# Patient Record
Sex: Female | Born: 1960 | Race: White | Hispanic: No | Marital: Married | Smoking: Never smoker
Health system: Southern US, Academic
[De-identification: ages and names within clinical notes are randomized; demographics above are authoritative.]

## PROBLEM LIST (undated history)

## (undated) DIAGNOSIS — Z853 Personal history of malignant neoplasm of breast: Secondary | ICD-10-CM

## (undated) DIAGNOSIS — K219 Gastro-esophageal reflux disease without esophagitis: Secondary | ICD-10-CM

## (undated) DIAGNOSIS — E785 Hyperlipidemia, unspecified: Secondary | ICD-10-CM

## (undated) DIAGNOSIS — F411 Generalized anxiety disorder: Secondary | ICD-10-CM

## (undated) DIAGNOSIS — I82409 Acute embolism and thrombosis of unspecified deep veins of unspecified lower extremity: Secondary | ICD-10-CM

## (undated) DIAGNOSIS — G4730 Sleep apnea, unspecified: Secondary | ICD-10-CM

## (undated) DIAGNOSIS — M797 Fibromyalgia: Secondary | ICD-10-CM

## (undated) DIAGNOSIS — H409 Unspecified glaucoma: Secondary | ICD-10-CM

## (undated) DIAGNOSIS — I749 Embolism and thrombosis of unspecified artery: Secondary | ICD-10-CM

## (undated) DIAGNOSIS — K760 Fatty (change of) liver, not elsewhere classified: Secondary | ICD-10-CM

## (undated) DIAGNOSIS — J329 Chronic sinusitis, unspecified: Secondary | ICD-10-CM

## (undated) DIAGNOSIS — E039 Hypothyroidism, unspecified: Secondary | ICD-10-CM

## (undated) DIAGNOSIS — G43909 Migraine, unspecified, not intractable, without status migrainosus: Secondary | ICD-10-CM

## (undated) HISTORY — DX: Personal history of malignant neoplasm of breast: Z85.3

## (undated) HISTORY — DX: Fibromyalgia: M79.7

## (undated) HISTORY — DX: Chronic sinusitis, unspecified: J32.9

## (undated) HISTORY — DX: Unspecified glaucoma: H40.9

## (undated) HISTORY — PX: HX RADICAL MASTECTOMY: SHX4

## (undated) HISTORY — DX: Hyperlipidemia, unspecified: E78.5

## (undated) HISTORY — DX: Fatty (change of) liver, not elsewhere classified: K76.0

## (undated) HISTORY — DX: Sleep apnea, unspecified: G47.30

## (undated) HISTORY — DX: Generalized anxiety disorder: F41.1

## (undated) HISTORY — DX: Migraine, unspecified, not intractable, without status migrainosus: G43.909

## (undated) HISTORY — DX: Gastro-esophageal reflux disease without esophagitis: K21.9

## (undated) HISTORY — DX: Hypothyroidism, unspecified: E03.9

## (undated) HISTORY — DX: Embolism and thrombosis of unspecified artery (CMS HCC): I74.9

## (undated) HISTORY — DX: Acute embolism and thrombosis of unspecified deep veins of unspecified lower extremity (CMS HCC): I82.409
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
## (undated) NOTE — Progress Notes (Signed)
Formatting of this note might be different from the original.  Patient's daughter stop by nurses station and stated that her mother was going to be going to [facility] for placement. Daughter stated Ilda Mori called her this morning and spoke with daughter in regards to her mother. Stated Ilda Mori was working on everything at this time. Daughter stated she was going to go get her mother some clothes so she will have some clothes for her admission to [facility].    Electronically signed by Rosanne Ashing, CNA at 04/13/2022 10:28 AM EDT

## (undated) NOTE — ED Notes (Signed)
Formatting of this note might be different from the original.  CA transferred patient to [facility] via [place]. Handoff report was given. All belongings taken. Pain level is 0/10. IV in [place].  Electronically signed by Deeann Dowse, RN at 04/11/2022 12:53 AM EDT
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
## (undated) NOTE — Progress Notes (Signed)
Formatting of this note might be different from the original.  Pt arrived via wheelchair to [facility] Room 108 from ED.  Vital signs taken & noted.  Pt was oriented to room, admission packet reviewed including the patients rights,copy handed to pt and/or family.Teach back methodology was used for this education. The patient/caregiver verbalized in his/her own words appropriate understanding of what he/she has been taught.   Nursing to complete admission database and assessment once pt is settled.  Call system in reach, will continue to monitor pts needs.  Electronically signed by Fredia Sorrow, RN at 04/11/2022  1:05 AM EDT

## (undated) NOTE — Home Health (Signed)
Formatting of this note might be different from the original.  Per Robin, SN, pt admitted to SNF.  Telehealth ended and arrangements made for equipment to be picked up.    Thank YOU!    [facility]  [phone]  [email or web address]  Electronically signed by Burnett Corrente, RN at 04/17/2022 10:25 AM EDT
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
## (undated) NOTE — Procedures (Signed)
Associated Order(s): EKG (12-LEAD)  Formatting of this note might be different from the original.  Ekg completed.   Original placed in physicians office in ED for review and interpretation by ED physician.  Electronically signed by Theodosia Paling, RRT at 04/13/2022  1:54 PM EDT

## (undated) NOTE — Home Health (Signed)
Formatting of this note might be different from the original.  This is to report to Dr. and ctl that Aide documented a missed visit on 04/09/22.      The reason for the missed visit was: Other (specify):  Unable to contact  Electronically signed by Wilhemina Cash, [place] at 04/09/2022  4:12 PM EDT

## (undated) NOTE — Progress Notes (Signed)
Formatting of this note might be different from the original.    Problem: Infection  Goal: Absence of Infection Signs and Symptoms  Outcome: Ongoing  Intervention: Prevent or Manage Infection  Flowsheets (Taken 04/15/2022 2042)  Infection Management: aseptic technique maintained  Fever Reduction/Comfort Measures:   lightweight clothing   lightweight bedding    Problem: Adult Inpatient Plan of Care  Goal: Plan of Care Review  Outcome: Ongoing  Flowsheets (Taken 04/12/2022 0315 by Forde Dandy, RN)  Progress: no change  Plan of Care Reviewed With: patient  Goal: Patient-Specific Goal (Individualized)  Outcome: Ongoing  Goal: Absence of Hospital-Acquired Illness or Injury  Outcome: Ongoing  Intervention: Identify and Manage Fall Risk  Flowsheets (Taken 04/15/2022 2042)  Safety Promotion/Fall Prevention: activity supervised  Intervention: Prevent Skin Injury  Flowsheets  Taken 04/15/2022 2200  Body Position: turns independently  Taken 04/15/2022 2042  Skin Protection:   adhesive use limited   transparent dressing maintained   tubing/devices free from skin contact  Intervention: Prevent and Manage VTE (Venous Thromboembolism) Risk  Flowsheets  Taken 04/15/2022 2042 by Carles Collet, RN  Activity Management: activity encouraged  Range of Motion: active ROM (range of motion) encouraged  Taken 04/15/2022 1000 by Alger Memos, RN  VTE Prevention/Management: patient refused mechanical DVT prophylaxis, patient informed of risks  Intervention: Prevent Infection  Flowsheets (Taken 04/15/2022 2042)  Infection Prevention:   hand hygiene promoted   personal protective equipment utilized  Goal: Optimal Comfort and Wellbeing  Outcome: Ongoing  Intervention: Monitor Pain and Promote Comfort  Flowsheets (Taken 04/14/2022 1000 by Alger Memos, RN)  Pain Management Interventions:   care clustered   pillow support provided  Intervention: [place] (Taken 04/15/2022 2042)  Trust  Relationship/Rapport:   care explained   choices provided   emotional support provided   empathic listening provided   questions answered   questions encouraged   reassurance provided   thoughts/feelings acknowledged  Goal: Readiness for Transition of Care  Outcome: Ongoing  Intervention: Mutually Develop Transition Plan  Flowsheets (Taken 04/12/2022 1707 by Bernadene Person, PT, DPT)  Equipment Currently Used at Home: (Pt reports she has a walker; raised toilet seat; showever chair, however poor historian) --    Problem: Skin Injury Risk Increased  Goal: Skin Health and Integrity  Outcome: Ongoing  Intervention: Optimize Skin Protection  Flowsheets  Taken 04/15/2022 2042 by Carles Collet, RN  Pressure Reduction Techniques:   frequent weight shift encouraged   turned/repositioned   weight shift assistance provided  Head of Bed (HOB) Positioning: HOB at 30-45 degrees  Skin Protection:   adhesive use limited   transparent dressing maintained   tubing/devices free from skin contact  Taken 04/15/2022 1000 by Alger Memos, RN  Pressure Reduction Devices: specialty bed utilized  Intervention: Promote and Optimize Oral Intake  Flowsheets (Taken 04/15/2022 1000 by Alger Memos, RN)  Oral Nutrition Promotion: rest periods promoted    Problem: Confusion Acute  Goal: Optimal Cognitive Function  Outcome: Ongoing  Intervention: Minimize Contributing Factors  Flowsheets (Taken 04/15/2022 2042)  Sensory Stimulation Regulation:   care clustered   lighting decreased   television on  Reorientation Measures:   clock in view   glasses use encouraged  Environment Familiarity/Consistency: familiar objects from home provided    Problem: Communication Impairment  Goal: Effective Communication Skills  Outcome: Ongoing  Intervention: Designer, jewellery  Flowsheets (Taken 04/15/2022 2042)  Communication Enhancement Strategies: call light answered in  person    Electronically signed by Carles Collet, RN at 04/15/2022  11:11 PM EDT

## (undated) NOTE — Progress Notes (Signed)
Formatting of this note might be different from the original.  Pt resting quietly at this time. Side rails up x 2. Bed alarm in use. Encouraged to call for any assistance. Next shift to cont to monitor.  Electronically signed by Sallye Ober, RN at 04/11/2022  6:26 PM EDT

## (undated) NOTE — Nursing Note (Signed)
Formatting of this note might be different from the original.  Resting quietly in bed, respirations regular and non labored. Offered to assist patient to the bathroom, patient voiced, " no, I'm fine." Patient refused to have her pull-up checked or the bottom assessed at this time. Side rails up X2, bed down in lowest position, and call bell in easy reach. Will continue to monitor.   Electronically signed by Forde Dandy, RN at 04/12/2022  6:13 AM EDT

## (undated) NOTE — ED Notes (Signed)
Formatting of this note might be different from the original.  Per Dr. Franchot Erichsen, COVID test and urine test can wait until patient wakes up.  Electronically signed by Linde Gillis, RN at 04/10/2022 10:25 PM EDT

## (undated) NOTE — Progress Notes (Signed)
Formatting of this note might be different from the original.  Report given to [place]. D/c summary faxed to number provided. Awaiting transportation at this time.  Electronically signed by Sallye Ober, RN at 04/16/2022  5:17 PM EDT

## (undated) NOTE — Home Health (Signed)
Formatting of this note might be different from the original.  This is to report to Dr. and ctl that Aide documented a missed visit on 04/05/22.      The reason for the missed visit was: Other (specify): call pt and left a voicemail and no call back  Electronically signed by Wilhemina Cash, [place] at 04/05/2022  5:43 PM EDT

## (undated) NOTE — Progress Notes (Signed)
Formatting of this note might be different from the original.  Pt resting quietly with hob elevated. pleasantly confused to place. Hob elevated and side rails  Up x 2. Bed alarm in use. Encouraged to call for any assistance. Will cont to monitor.  Electronically signed by Sallye Ober, RN at 04/11/2022  7:35 AM EDT

## (undated) NOTE — Nursing Note (Signed)
Formatting of this note might be different from the original.  Pt tried to get out of bed to go to window, said her babies were outside and she wanted to see them. There wasn't anyone outside the window. Told pt that there wasn't anyone outside and that she needed to stay in bed. She said that she will go back to bed and tell them that her nurse wouldn't let her see them, told pt that if someone was coming to see her, they could come through the door to see her.   Electronically signed by Donato Heinz, [facility] at 04/11/2022  4:00 PM EDT

## (undated) NOTE — Consults (Signed)
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 encounter, ask questions, security issues and sharing information. The patient consented to proceed with the telemedicine encounter visit.     I spent 30 minutes on preparation for this visit on the date of service and 30 minutes directly with the patient.  I was located remotely at the following location during this encounter: office   Electronically signed by Yvette Rack, MD at 04/12/2022  5:30 PM EDT

## (undated) NOTE — Progress Notes (Signed)
Formatting of this note might be different from the original.  Patient refused to allow CNA to help with bathing needs and to be changed with first attempt. CNA attempt a second time and patient allowed CNA to help with bathing/bed change.    Electronically signed by Rosanne Ashing, CNA at 04/12/2022  4:37 PM EDT

## (undated) NOTE — Progress Notes (Signed)
Formatting of this note might be different from the original.    Problem: Infection  Goal: Absence of Infection Signs and Symptoms  Outcome: Ongoing  Intervention: Prevent or Manage Infection  Flowsheets (Taken 04/13/2022 2023)  Infection Management: aseptic technique maintained  Fever Reduction/Comfort Measures:   lightweight clothing   lightweight bedding    Problem: Adult Inpatient Plan of Care  Goal: Plan of Care Review  Outcome: Ongoing  Flowsheets (Taken 04/12/2022 0315 by Forde Dandy, RN)  Progress: no change  Plan of Care Reviewed With: patient  Goal: Patient-Specific Goal (Individualized)  Outcome: Ongoing  Goal: Absence of Hospital-Acquired Illness or Injury  Outcome: Ongoing  Intervention: Identify and Manage Fall Risk  Flowsheets (Taken 04/13/2022 2023)  Safety Promotion/Fall Prevention:   activity supervised   assistive device/personal items within reach   clutter-free environment maintained   lighting adjusted   nonskid shoes/slippers when out of bed   safety round/check completed  Intervention: Prevent Skin Injury  Flowsheets  Taken 04/13/2022 2159  Body Position:   left   turned   side-lying  Taken 04/13/2022 2023  Skin Protection:   adhesive use limited   transparent dressing maintained   tubing/devices free from skin contact  Intervention: Prevent and Manage VTE (Venous Thromboembolism) Risk  Flowsheets (Taken 04/13/2022 2023)  Activity Management: activity encouraged  VTE Prevention/Management: patient refused mechanical DVT prophylaxis, patient informed of risks  Range of Motion: active ROM (range of motion) encouraged  Intervention: Prevent Infection  Flowsheets (Taken 04/13/2022 2023)  Infection Prevention:   hand hygiene promoted   personal protective equipment utilized  Goal: Optimal Comfort and Wellbeing  Outcome: Ongoing  Intervention: Monitor Pain and Promote Comfort  Flowsheets (Taken 04/13/2022 0800 by Alger Memos, RN)  Pain Management Interventions:   care clustered   pillow support  provided   position adjusted  Intervention: Provide [place] (Taken 04/13/2022 2023)  Trust Relationship/Rapport:   care explained   choices provided   emotional support provided   empathic listening provided   questions answered   questions encouraged   reassurance provided   thoughts/feelings acknowledged  Goal: Readiness for Transition of Care  Outcome: Ongoing  Intervention: Mutually Develop Transition Plan  Flowsheets (Taken 04/12/2022 1707 by Bernadene Person, PT, DPT)  Equipment Currently Used at Home: (Pt reports she has a walker; raised toilet seat; showever chair, however poor historian) --    Problem: Skin Injury Risk Increased  Goal: Skin Health and Integrity  Outcome: Ongoing  Intervention: Optimize Skin Protection  Flowsheets  Taken 04/13/2022 2023 by Carles Collet, RN  Pressure Reduction Techniques:   frequent weight shift encouraged   independent turning  Skin Protection:   adhesive use limited   transparent dressing maintained   tubing/devices free from skin contact  Taken 04/13/2022 0800 by Alger Memos, RN  Head of Bed [facility]) Positioning: HOB at 30 degrees  Pressure Reduction Devices: specialty bed utilized  Intervention: Promote and Optimize Oral Intake  Flowsheets (Taken 04/12/2022 2120 by Forde Dandy, RN)  Oral Nutrition Promotion: rest periods promoted    Problem: Confusion Acute  Goal: Optimal Cognitive Function  Outcome: Ongoing  Intervention: Minimize Contributing Factors  Flowsheets (Taken 04/13/2022 2023)  Sensory Stimulation Regulation:   care clustered   television on  Reorientation Measures: clock in view  Environment Familiarity/Consistency: familiar objects from home provided    Problem: Communication Impairment  Goal: Effective Communication Skills  Outcome: Ongoing  Intervention: Control and instrumentation engineer (  Taken 04/13/2022 2023)  Communication Enhancement Strategies:   call light answered in person   extra time allowed for response    one-step directions provided    Electronically signed by Carles Collet, RN at 04/13/2022 10:17 PM EDT
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
## (undated) NOTE — Progress Notes (Signed)
Formatting of this note might be different from the original.    Problem: Infection  Goal: Absence of Infection Signs and Symptoms  Outcome: Ongoing  Intervention: Prevent or Manage Infection  Description: Implement transmission-based precautions and isolation, as indicated, to prevent spread of infection.  Obtain cultures prior to initiating antimicrobial therapy, when possible. Do not delay treatment for laboratory results in the presence of high suspicion or clinical indicators.  Administer ordered antimicrobial therapy promptly; reassess need regularly.  Monitor laboratory value, diagnostic test and clinical status trends for signs of infection progression.  Identify early signs of sepsis, such as increased heart rate and decreased blood pressure, as well as changes in mental state, respiratory pattern or peripheral perfusion.  Prepare for rapid sepsis management, including lactate level, intravenous access, fluid administration and oxygen therapy.  Provide fever-reduction and comfort measures.  Flowsheets (Taken 04/12/2022 2120)  Infection Management: aseptic technique maintained  Fever Reduction/Comfort Measures:   lightweight bedding   lightweight clothing    Problem: Adult Inpatient Plan of Care  Goal: Plan of Care Review  Outcome: Ongoing  Flowsheets (Taken 04/12/2022 0315)  Progress: no change  Plan of Care Reviewed With: patient  Goal: Patient-Specific Goal (Individualized)  Outcome: Ongoing  Goal: Absence of Hospital-Acquired Illness or Injury  Outcome: Ongoing  Intervention: Identify and Manage Fall Risk  Description: Perform standard risk assessment on admission using a validated tool or comprehensive approach appropriate to the patient; reassess fall risk frequently, with change in status or transfer to another level of care.  Communicate fall injury risk to interprofessional healthcare team.  Determine need for increased observation, equipment and environmental modification, such as low bed, signage  and supportive, nonskid footwear.  Adjust safety measures to individual developmental age, stage and identified risk factors.  Reinforce the importance of safety and physical activity with patient and family.  Perform regular intentional rounding to assess need for position change, pain assessment and personal needs, including assistance with toileting.  Flowsheets (Taken 04/12/2022 2334)  Safety Promotion/Fall Prevention:   safety round/check completed   room organization consistent   nonskid shoes/slippers when out of bed   assistive device/personal items within reach   clutter-free environment maintained   fall prevention program maintained   lighting adjusted  Intervention: Prevent Skin Injury  Description: Perform a screening for skin injury risk, such as pressure or moisture associated skin damage on admission and at regular intervals throughout hospital stay.  Keep all areas of skin (especially folds) clean and dry.  Maintain adequate skin hydration.  Relieve and redistribute pressure and protect bony prominences; implement measures based on patient-specific risk factors.  Match turning and repositioning schedule to clinical condition.  Encourage weight shift frequently; assist with reposition if unable to complete independently.  Float heels off bed; avoid pressure on the Achilles tendon.  Keep skin free from extended contact with medical devices.  Encourage functional activity and mobility, as early as tolerated.  Use aids (e.g., slide boards, mechanical lift) during transfer.  Flowsheets  Taken 04/12/2022 2335  Body Position:   position changed independently   turns independently  Taken 04/12/2022 2120  Skin Protection:   adhesive use limited   incontinence pads utilized   transparent dressing maintained   tubing/devices free from skin contact  Intervention: Prevent and Manage VTE (Venous Thromboembolism) Risk  Description: Assess for VTE (venous thromboembolism) risk.  Encourage and assist with early  ambulation.  Initiate and maintain compression or other therapy, as indicated, based on identified risk in accordance  with organizational protocol and provider order.  Encourage both active and passive leg exercises while in bed, if unable to ambulate.  Flowsheets (Taken 04/12/2022 2120)  Activity Management: activity encouraged  VTE Prevention/Management: patient refused mechanical DVT prophylaxis, patient informed of risks  Range of Motion: active ROM (range of motion) encouraged  Intervention: Prevent Infection  Description: Maintain skin and mucous membrane integrity; promote hand, oral and pulmonary hygiene.  Optimize fluid balance, nutrition, sleep and glycemic control to maximize infection resistance.  Identify potential sources of infection early to prevent or mitigate progression of infection (e.g., wound, lines, devices).  Evaluate ongoing need for invasive devices; remove promptly when no longer indicated.  Flowsheets (Taken 04/12/2022 2120)  Infection Prevention:   hand hygiene promoted   rest/sleep promoted   single patient room provided  Goal: Optimal Comfort and Wellbeing  Outcome: Ongoing  Intervention: Monitor Pain and Promote Comfort  Description: Assess pain level, treatment efficacy and patient response at regular intervals using a consistent pain scale.  Consider the presence and impact of preexisting chronic pain.  Encourage patient and caregiver involvement in pain assessment, interventions and safety measures.  Flowsheets (Taken 04/12/2022 2120)  Pain Management Interventions:   pillow support provided   position adjusted   quiet environment facilitated  Intervention: Provide Person-Centered Care  Description: Use a family-focused approach to care.  Develop trust and rapport by proactively providing information, encouraging questions, addressing concerns and offering reassurance.  Acknowledge emotional response to hospitalization.  Recognize and utilize personal coping strategies.  Honor  spiritual and cultural preferences.  Flowsheets (Taken 04/12/2022 2120)  Trust Relationship/Rapport:   care explained   choices provided   emotional support provided   empathic listening provided   questions answered   questions encouraged   reassurance provided   thoughts/feelings acknowledged  Goal: Readiness for Transition of Care  Outcome: Ongoing    Problem: Skin Injury Risk Increased  Goal: Skin Health and Integrity  Outcome: Ongoing  Intervention: Optimize Skin Protection  Description: Perform a full pressure injury risk assessment, as indicated by screening, upon admission to care unit.  Reassess skin (injury risk, full inspection) frequently (e.g., scheduled interval, with change in condition) to provide optimal early detection and prevention.  Maintain adequate tissue perfusion (e.g., encourage fluid balance; avoid crossing legs, constrictive clothing or devices) to promote tissue oxygenation.  Maintain head of bed at lowest degree of elevation tolerated, considering medical condition and other restrictions.  Avoid positioning onto an area that remains reddened.  Minimize incontinence and moisture (e.g., toileting schedule; moisture-wicking pad, diaper or incontinence collection device; skin moisture barrier).  Cleanse skin promptly and gently when soiled utilizing a pH-balanced cleanser.  Relieve and redistribute pressure (e.g., scheduled position changes, weight shifts, use of support surface, medical device repositioning, protective dressing application, use of positioning device, microclimate control, use of pressure-injury-monitor  Encourage increased activity, such as sitting in a chair at the bedside or early mobilization, when able to tolerate.  Flowsheets (Taken 04/12/2022 2120)  Pressure Reduction Techniques:   frequent weight shift encouraged   independent turning   tubing/devices free from under/on patient  Head of Bed (HOB) Positioning: HOB at 20-30 degrees  Pressure Reduction Devices: specialty  bed utilized  Skin Protection:   adhesive use limited   incontinence pads utilized   transparent dressing maintained   tubing/devices free from skin contact  Intervention: Promote and Optimize Oral Intake  Description: Perform a nutrition assessment; include a nutrition-focused physical exam.  Determine calorie, protein, vitamin, mineral  and fluid requirements.  Assess for micronutrient deficiencies; supplement if depleted.  Assess need and assist with meal set-up and feeding.  Adjust diet or meal schedule based on preferences and tolerance.  Offer oral supplemental food or drinks to enhance calorie and protein intake.  Establish bowel elimination program to increase comfort and appetite.  Minimize unnecessary dietary restrictions to increase oral intake.  Provide and encourage oral hygiene to enhance desire to eat.  Consider enteral nutrition support if oral intake remains inadequate; provide parenteral nutrition if enteral is contraindicated.  Flowsheets (Taken 04/12/2022 2120)  Oral Nutrition Promotion: rest periods promoted    Problem: Confusion Acute  Goal: Optimal Cognitive Function  Outcome: Ongoing  Intervention: Minimize Contributing Factors  Description: Establish baseline and ongoing mental status using a validated tool.  Investigate potential physiologic factors for confusion (e.g., hypoglycemia, hypoxia, fluid imbalance, sleep deprivation, malnutrition); advocate for and provide treatment.  Minimize stimulation; provide a quiet and calm environment; enlist the help of familiar family or caregiver.  Consider use of complementary and alternative medicine modalities (e.g., therapeutic touch, massage, aromatherapy).  Facilitate safe, barrier-free surroundings; keep needed items within reach (e.g., call light, personal belongings).  Promote use of personal vision and auditory aids.  Provide familiar items and orientation cues (e.g., calendar, clock, pictures).  Establish familiarity using a structured routine  congruent with home schedule, when possible (e.g., consistent caregiver, sleep and meal schedule, toileting schedule).  Use communication techniques that avoid confrontation and correction; approach each interaction as a new episode of engagement.  Flowsheets (Taken 04/12/2022 2120)  Sensory Stimulation Regulation:   auditory stimulation minimized   care clustered   lighting decreased   television on   quiet environment promoted  Reorientation Measures:   calendar in view   familiar social contact encouraged   glasses use encouraged   reorientation provided  Environment Familiarity/Consistency:   daily routine followed   familiar objects from home provided    Problem: Communication Impairment  Goal: Effective Communication Skills  Outcome: Ongoing  Intervention: Optimize Communication Skills  Description: Evaluate communication skills with cognitive-communication, speech and language assessments to identify areas of impairment.  Make hearing aids, glasses and dentures available.  Determine optimal means of communication; support multimodal methods and ensure consistent use.  Provide cuing and prompting, as needed, to encourage independence.  Utilize slow, distinct, concise speech with simple verbal and gestured commands as needed; allow adequate time for responses.  Provide a calm environment without excessive noise or distraction; avoid situations overly frustrating for the patient.  Provide therapeutic interventions to address communication impairment, such as auditory comprehension, verbal expression, motor speech and pragmatic language, as well as reading and writing skills.  Flowsheets (Taken 04/12/2022 2120)  Communication Enhancement Strategies:   extra time allowed for response   call light answered in person    Electronically signed by Forde Dandy, RN at 04/13/2022 12:26 AM EDT
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
## (undated) NOTE — Progress Notes (Signed)
Formatting of this note might be different from the original.  Pt refused evening meds and dinner tray. Medicated for nausea.  Electronically signed by Sallye Ober, RN at 04/11/2022  6:08 PM EDT

## (undated) NOTE — Nursing Note (Signed)
Formatting of this note might be different from the original.  In to ask patient to get up and sit in chair.  Patient refuses to get up, states her belly hurts.   Electronically signed by Alger Memos, RN at 04/14/2022  4:48 PM EDT

## (undated) NOTE — Nursing Note (Signed)
Formatting of this note might be different from the original.  Resting quietly in bed, watching television. Patient has a flat affect during conversation. Alert and oriented to person. Patient can not remember her birthday or year. Re-oriented patient to her birthday and surroundings. Patient refused on assessment to have her bottom or back assessed. Patient also refused to take her PO Lactulose Ordered, see MAR. Saline lock intact to left forearm with no redness or edema noted. Side rails up X2, bed down in lowest position, and call bell in easy reach. Bed alarm on and activated. Will continue to monitor.   Electronically signed by Forde Dandy, RN at 04/13/2022 12:33 AM EDT

## (undated) NOTE — Progress Notes (Signed)
Formatting of this note might be different from the original.  INPATIENT PHYSICAL THERAPY TREATMENT NOTE  Treatment Date:  04/13/2022    Treatment Start/Stop Time:  1:16 PM - 1:36 PM = 20 minutes     S:  Pt was compliant with PT this date.   O:  Chart reviewed.  General Observations:  Pt in bed on arrival.    Mobility: Bed Mobility: Supine to sit: Min Assist, instructional cueing, extra time  Sit to supine: Min Assist, instructional cueing, extra time  Transfers: Sit to stand: [place], instructional cueing, extra time, set-up from bed height to FWW  Stand to sit: Contact guard, instructional cueing, extra time  Gait: Gait details: Pt ambulated approximately 15 ft with FWW and therapist providing CGA with gait belt.    ROM/Therapeutic Exercise: STS from bed to FWW x 5  Pain (NPRS):  Location: Pt didn't report any pain this date.  Rest: 0/10  Activity: 0 /10       Patient/Caregiver Education: educated pt on transferring from seated to standing position and vice versa with the proper technique.  Interdisciplinary Contacts: Discussed patient status with nurse.  Patient left in bed with alarm re-activated, items in reach, lines intact and nursing aware    A:  Pt continues to meet criteria for skilled intervention during this admission. Pt ambulated approximately 15 ft with FWW and therapist providing CGA with gait belt. After ambulating, the therapist asked if pt wanted to perform more exercises seated on EOB before getting back in bed, however pt declined stating "I am ready to lay down, I will try to do better next time."    P:  Continue current plan of care 4-5/wk with focus on progressive mobility.    Therapist: Honor Junes, PTA         Date: 04/13/2022  Electronically signed by Honor Junes, PTA at 04/13/2022  1:56 PM EDT
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
## (undated) NOTE — Progress Notes (Signed)
Formatting of this note might be different from the original.  Pt leaving floor for mri at this time.  Electronically signed by Sallye Ober, RN at 04/16/2022 10:56 AM EDT
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
## (undated) NOTE — Progress Notes (Signed)
Formatting of this note might be different from the original.  Pt resting quietly at this time. Did get dizzy with PT. Vs stable. No acute distress noted. Side rails up x 2. Encouraged to call for any assistance. Next shift to cont to monitor.  Electronically signed by Sallye Ober, RN at 04/12/2022  6:44 PM EDT

## (undated) NOTE — Home Health (Signed)
Formatting of this note might be different from the original.  Patient is a 68 y/o female seen for OT home health evaluation following recent hospitalization at [facility]. Patient was found to be hypercoaguable (INR was 9 on admission), as patient has been on coumadin due to history of DVT and also has the Factor V clotting disorders. Patient went to [facility] ED on 03/02/22 due to a syncopal episode at [facility]. Family reported that she slowly slumped down, however did not fall. They also stated she had some slurred speech initially and was warm to touch, however when she arrived to [facility] ED she was alert and oriented x 3, but the family requested stroke protocol. Patient has a history of cirrhosis ([place]) which was reported by the family at the ED and stated she had been taking lactulose for high ammonia levels, but recently had not been taking it. Patient was admitted for observation and was discharged home with home health to assist with education on coagulation defect, unspecified and other disease processes as well as medication education. Her daughter manages her medications, and noted that the patient has not been taking her lactulose as prescribed.   Patient lives in her single wide trailer with her daughter who is her primary caregiver. Patient needed set up to min A for ADLs PLOF, Caregiver provides IADLs. Patient was ambulating with SPC as needed for assistive device. Patient now needs min to mod A for ADLs and continues to ambulate with a cane but has noted balance and gait deficits. Patient is increased falls risk. Patient demonstrating functional BUE AROM but with weakness noted. Patient is alert and oriented to year and president. Unable to correctly recall day, date, month or season. Caregiver reports that patient frequently asks the same questions and repeats herself. Patient fatigues with minimal activity. Patient goal is to be more mobile and to be able to do more for herself.   Plan for next visit, begin memory  assessments for SLUMS/MOCA, begin UE HEP    Past Medical History: Syncope and collapse, history of DVT, Cirrhosis ([place]), chronic anemia, hypothyroidism, hyperlipidemia, unspecified, anxiety, migraine headaches, depression, seizure disorder, obesity, Factor V Leiden Deficiency, hepatic encephalopathy, GERD and long term (current) use of anticoagulants, h/o R breast cancer in 2007 s/p mastectomy, radiation and chemo  Electronically signed by Sadie Haber, OTR at 04/09/2022 10:05 AM EDT

## (undated) NOTE — Nursing Note (Signed)
Formatting of this note might be different from the original.  Resting quietly in bed quietly. Alert and awake when entered room. Denies needs or concerns. Encouraged patient to call when needed.   Electronically signed by Forde Dandy, RN at 04/12/2022  6:14 AM EDT

## (undated) NOTE — Progress Notes (Signed)
Formatting of this note might be different from the original.  Per staff in the business office @ [facility] this patient is a long term care resident. Patient is removed from [place], [facility] 05/22/2022 11:53 AM    Electronically signed by Huston Foley, MOA at 05/22/2022 11:53 AM EDT
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
## (undated) NOTE — Progress Notes (Signed)
Formatting of this note might be different from the original.  Pt resting quietly with hob elevated. Ambulatory to bsc with assistance. Call light in reach.. bed alarm in use. Encouraged to call for any assistance. Will cont to monitor.  Electronically signed by Sallye Ober, RN at 04/16/2022  9:49 AM EDT
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
## (undated) NOTE — Nursing Note (Signed)
Formatting of this note might be different from the original.  Up to bedside commode with assistance. Voided and passed a moderate bowel movement with out difficulty. C/o of dizziness when standing up and changing position to go back to bed. Side rails up X2, bed down in lowest position, and call bell in easy reach. Bed alarm on and activated.   Electronically signed by Forde Dandy, RN at 04/13/2022  2:47 AM EDT

## (undated) NOTE — Consults (Signed)
Formatting of this note is different from the original.  Images from the original note were not included.    Department of Pharmacy    Pharmacy Progress Note  Warfarin Dosing Consult     Subjective/Objective     Kelly Harrison is a 5 y.o. female who is receiving warfarin for history of DVT     Reported home warfarin regimen: '7mg'$     (Source: [facility])    Goal INR is 2-3 with order to hold warfarin for INR 3.5 per Prescriber.    Height: 167.6 cm ('5\' 6"'$ )  Weight: 104.8 kg (231 lb)    Recent Labs     04/15/22  0402 04/14/22  0345   HGB 11.8* 12.2   HCT 36.0 37.9     Warfarin Dosing History During this Admission    Date INR Dose  04/12/22 2.1 '3mg'$    04/13/22 1.8 None given  04/14/22 2.0 '3mg'$   04/15/22 1.4 '4mg'$      Pertinent Drug Interactions / Anticoagulation Therapy:     Significant drug-drug interactions:  none  Bridging/overlap therapy: no    Assessment/Plan:     1. Today's warfarin dose: will give additional '1mg'$  for total dose today='4mg'$   2. Current regimen: '3mg'$  daily  1. Obtain INR daily.     Please call Pharmacy with any questions.    Thank you,  Osker Mason, PharmD 04/15/2022 6:31 AM    Electronically signed by Osker Mason, PharmD at 04/15/2022  6:33 AM EDT

## (undated) NOTE — Procedures (Signed)
Associated Order(s): EKG (12-LEAD)  Formatting of this note might be different from the original.  Ekg completed.   Original placed in physicians office in ED for review and interpretation by ED physician.  Electronically signed by Theodosia Paling, RRT at 04/10/2022  3:31 PM EDT

## (undated) NOTE — ED Notes (Signed)
Formatting of this note is different from the original.  Images from the original note were not included.    ED Provider Note    Subjective     Chief Complaint   Patient presents with   ? Altered mental status   ? Hand Burn   ? Burn     Both butt cheeks     Accepted transfer of care from Dr. Humphrey Rolls pending labs, treatment response, refer to original note for HPI, ROS, and physical exam.     Objective     ED Triage Vitals [04/10/22 1416]   BP Pulse Resp Temp SpO2 Flow (L/min)   117/57 107 18 97.8 F (36.6 C) 94 % --     Patient Vitals for the past 24 hrs:   BP Temp Pulse Resp SpO2   04/10/22 2242 (!) 166/82 -- 101 20 98 %   04/10/22 2101 (!) 145/84 -- 100 18 98 %   04/10/22 1915 -- -- -- 18 --   04/10/22 1631 (!) 119/54 98.1 F (36.7 C) (!) 122 18 --   04/10/22 1416 117/57 97.8 F (36.6 C) 107 18 94 %     Physical Exam    Results for orders placed or performed during the hospital encounter of 04/10/22   CBC WITH AUTO DIFF (CBCD)   Result Value Ref Range    WBC 8.8 4.0 - 10.5 K/uL    RBC 4.00 (L) 4.1 - 5.1 M/uL    Hemoglobin 12.8 12.0 - 16.0 g/dL    Hematocrit 39.1 36 - 46 %    MCV 97.8 78 - 98 fL    MCH 32.0 27 - 34.6 pg    MCHC 32.7 (L) 33 - 37 g/dL    RDW 15.3 (H) 11.5 - 14.5 %    Platelet Count 272 130 - 400 K/uL    MPV 10.6 9.4 - 12.4 fL    Seg 63.6 %    Lymph 25.6 %    Monos 9.0 %    Eos 1.3 %    Baso 0.5 %    Absolute Neut 5.6 1.8 - 7.7 K/uL    Absolute Lymph 2.3 1.0 - 5.0 K/uL    Absolute Mono 0.8 0 - 0.8 K/uL    Absolute Eos 0.1 0 - 0.4 K/uL    Absolute Basophils 0.0 0 - 0.2 K/uL   LACTIC ACID (LACT)   Result Value Ref Range    Lactic Acid 2.0 0.4 - 2.2 MMOL/L   AMMONIA (NH3)   Result Value Ref Range    Ammonia 23 11 - 32 UMOL/L   ALCOHOL [facility])   Result Value Ref Range    Alcohol <0.003 <0.003 % WT/VOL    Collection Tech Id [id number]     Iodine Prep Used? Yes Yes   COMPREHENSIVE METABOLIC PANEL(COMP)   Result Value Ref Range    Sodium 142 136 - 148 MMOL/L    Potassium 3.0 (L) 3.5 - 5.2 MMOL/L    Chloride 107  98 - 108 MMOL/L    CO2 25 20 - 32 MMOL/L    Urea Nitrogen 9 7 - 23 MG/DL    Creatinine 1.09 (H) 0.55 - 1.02 MG/DL    Glucose, Bld 105 74 - 106 mg/dL    Total Protein 7.3 5.7 - 8.2 G/DL    Albumin 3.5 3.2 - 5.0 G/DL    Calcium 10.5 (H) 8.5 - 10.4 MG/DL    Total Bilirubin 0.3 0.2 - 1.1 MG/DL  Alkaline Phosphatase, Serum 66 46 - 116 U/L    AST 22 15 - 37 U/L    ALT 22 14 - 59 U/L    Globulin 3.8 1.7 - 3.9 G/DL    Albumin/Globulin 0.9 0.7 - 2.3 RATIO    Anion Gap 10 8 - 18 mmol/L    Osmolality Calc 293 278 - 305 MOS/KG    Bun/Creatinine 8.3 7 - 20    Glom Filt Rate, Estimated 56 (L) >60 ml/min/1.26m   MAGNESIUM(MG)   Result Value Ref Range    Magnesium 1.7 1.6 - 2.6 MG/DL   APTT(PTT)   Result Value Ref Range    PTT 36.0 (H) 22 - 34 SEC   PROTIME-INR(PT)   Result Value Ref Range    Pt(Patient) 30.8 (H) 9.0 - 11.5 SEC    INR 3.0 Ratio   EKG (12-LEAD)    Narrative    DTheodosia Paling RRT     04/10/2022  3:31 PM  Ekg completed.   Original placed in physicians office in ED for   review and interpretation by ED physician.     Assessment and Plan:     MDM "Risk" in the MDM section refers to billing criteria on potential for complications and/or morbidity/mortality of management as defined by the [facility]and CMS    Procedures     ED Course:  ED Course as of 04/11/22 0003   Tue Apr 10, 2022   1449 ALCOHOL ([facility] [LM]   1449 DRUG SCREEN 13, UNCONFIRM URINE (UDS13)([facility]-DO NOT ORDER) URINE-RANDOM [LM]   1449 URINALYSIS(UA) URINE-DIAGNOSTIC CATHETER [LM]   1449 LACTIC ACID (LACT) [LM]   12671URINALYSIS(UA) URINE-CLEAN CATCH [LM]   1449 PROTIME-INR(PT) [LM]   1449 APTT(PTT) [LM]   1449 COMPREHENSIVE METABOLIC PANEL(COMP) [LM]   1449 CBC WITH AUTO DIFF (CBCD) [LM]   12458MAGNESIUM(MG) [LM]   1449 XR CHEST 1 VW [LM]   10998EKG (12-LEAD) [LM]   1457 XR CHEST 1 VW  Radiologist impression as follows: No acute findings   [LM]   1714 Ammonia: 23 [LM]   1714 Lactic Acid: 2.0 [LM]   1824 Potassium(!): 3.0  Will replenish w/ 40 meq PO  supplementation. [LM]   2241 SARS COV 2, INFLU. A/B, RSV PCR (COFRP) NASOPHARYNX [LM]     ED Course User Index  [LM] MDeforest Hoyles Scribe     Medications   lactulose ([facility] solution 30 g (30 g Oral Given 04/10/22 1828)   potassium chloride 20 mEq/15 mL solution 40 mEq (40 mEq Oral Given 04/10/22 1828)   haloperidol lactate (HALDOL) injection 2 mg (2 mg Intravenous Given 04/10/22 1906)     9:00 PM Pt seen and examined. Pt currently sleeping soundly, will continue to monitor.    10:28 PM [place] to obtain TDO.   11:42 PM patient is awake alert inappropriate laughter nursing staff obtaining urine specimen.  12:01 AM discussion with nursing staff who had spoken to the patient's daughter this is not a new occurrence for the patient patient has had progressive worsening dementia for some time she is just in a point where she is unable to care for the patient and feels patient needs to be placed into a facility.  Family is requesting patient be admitted for nursing home placement.  Reviewed laboratory studies.  Discussed with patient replace patient potassium and admit for placement.  Clinical Impression:  1. Agitated    2. Aggressive behavior  3. Restless    4. Debility    5. Acute encephalopathy      Condition: Stable    Disposition: Admit    Documentation prepared by Deforest Hoyles, Scribe acting as medical scribe for Feliberto Harts MD. I was present and performed active documentation with the provider while the patient was being cared for in the emergency department. 04/10/2022 8:34 PM    Portions of this note were prepared by a scribe and I have reviewed with the scribe and verify the authenticity of this record as a scribed note undertaken during the medical encounter. Fraser Din, MD 04/11/2022 12:03 AM      Electronically signed by Fraser Din, MD at 04/11/2022 12:03 AM EDT

## (undated) NOTE — Home Health (Signed)
Formatting of this note might be different from the original.  SNA  Continued education toward patient goals  Electronically signed by Adella Nissen, RN at 04/11/2022  6:42 AM EDT

## (undated) NOTE — Progress Notes (Signed)
Formatting of this note might be different from the original.  Care plan reviewed  Electronically signed by Alger Memos, RN at 04/14/2022  4:45 PM EDT

## (undated) NOTE — Home Health (Signed)
Formatting of this note might be different from the original.  [facility] IDT Meeting Report  Visit dates 04/15/2022 to 04/21/2022    Patient has been [place] from [facility] services  No further weekly reports to follow    SN: DC from hospital to [facility] 04/16/2022    PT: Missed visit    OT: Missed visit    ST: Not in at this time    SW: Initial consult completed; monitoring for follow up  Electronically signed by Lazarus Salines, PTA at 04/23/2022  9:25 AM EDT

## (undated) NOTE — Unmapped External Note (Signed)
Formatting of this note might be different from the original.  UAI submitted to [facility] for completion.  Electronically signed by Sterling Big, RN at 04/13/2022 12:31 PM EDT

## (undated) NOTE — Nursing Note (Signed)
Formatting of this note might be different from the original.  Patient is refusing to get up to chair this morning.  Will attempt again later.   Electronically signed by Alger Memos, RN at 04/15/2022  9:01 AM EDT

## (undated) NOTE — Progress Notes (Signed)
Formatting of this note might be different from the original.    Problem: Infection  Goal: Absence of Infection Signs and Symptoms  Outcome: Ongoing  Intervention: Prevent or Manage Infection  Description: Implement transmission-based precautions and isolation, as indicated, to prevent spread of infection.  Obtain cultures prior to initiating antimicrobial therapy, when possible. Do not delay treatment for laboratory results in the presence of high suspicion or clinical indicators.  Administer ordered antimicrobial therapy promptly; reassess need regularly.  Monitor laboratory value, diagnostic test and clinical status trends for signs of infection progression.  Identify early signs of sepsis, such as increased heart rate and decreased blood pressure, as well as changes in mental state, respiratory pattern or peripheral perfusion.  Prepare for rapid sepsis management, including lactate level, intravenous access, fluid administration and oxygen therapy.  Provide fever-reduction and comfort measures.  Flowsheets  Taken 04/11/2022 2100 by Forde Dandy, RN  Infection Management: aseptic technique maintained  Taken 04/11/2022 0058 by Fredia Sorrow, RN  Fever Reduction/Comfort Measures:   lightweight bedding   lightweight clothing    Problem: Adult Inpatient Plan of Care  Goal: Plan of Care Review  Outcome: Ongoing  Flowsheets (Taken 04/12/2022 0315)  Progress: no change  Plan of Care Reviewed With: patient  Goal: Patient-Specific Goal (Individualized)  Outcome: Ongoing  Goal: Absence of Hospital-Acquired Illness or Injury  Outcome: Ongoing  Intervention: Identify and Manage Fall Risk  Description: Perform standard risk assessment on admission using a validated tool or comprehensive approach appropriate to the patient; reassess fall risk frequently, with change in status or transfer to another level of care.  Communicate fall injury risk to interprofessional healthcare team.  Determine need for increased observation,  equipment and environmental modification, such as low bed, signage and supportive, nonskid footwear.  Adjust safety measures to individual developmental age, stage and identified risk factors.  Reinforce the importance of safety and physical activity with patient and family.  Perform regular intentional rounding to assess need for position change, pain assessment and personal needs, including assistance with toileting.  Flowsheets (Taken 04/12/2022 0200)  Safety Promotion/Fall Prevention:   safety round/check completed   nonskid shoes/slippers when out of bed   room organization consistent   assistive device/personal items within reach   clutter-free environment maintained   fall prevention program maintained   lighting adjusted  Intervention: Prevent Skin Injury  Description: Perform a screening for skin injury risk, such as pressure or moisture associated skin damage on admission and at regular intervals throughout hospital stay.  Keep all areas of skin (especially folds) clean and dry.  Maintain adequate skin hydration.  Relieve and redistribute pressure and protect bony prominences; implement measures based on patient-specific risk factors.  Match turning and repositioning schedule to clinical condition.  Encourage weight shift frequently; assist with reposition if unable to complete independently.  Float heels off bed; avoid pressure on the Achilles tendon.  Keep skin free from extended contact with medical devices.  Encourage functional activity and mobility, as early as tolerated.  Use aids (e.g., slide boards, mechanical lift) during transfer.  Flowsheets  Taken 04/12/2022 0000  Body Position:   position changed independently   turns independently  Taken 04/11/2022 2100  Skin Protection:   adhesive use limited   incontinence pads utilized   transparent dressing maintained   tubing/devices free from skin contact  Intervention: Prevent and Manage VTE (Venous Thromboembolism) Risk  Description: Assess for VTE  (venous thromboembolism) risk.  Encourage and assist with early ambulation.  Initiate and maintain compression or other therapy, as indicated, based on identified risk in accordance with organizational protocol and provider order.  Encourage both active and passive leg exercises while in bed, if unable to ambulate.  Flowsheets (Taken 04/11/2022 2100)  Activity Management: activity encouraged  VTE Prevention/Management: patient refused mechanical DVT prophylaxis, patient informed of risks  Range of Motion: active ROM (range of motion) encouraged  Intervention: Prevent Infection  Description: Maintain skin and mucous membrane integrity; promote hand, oral and pulmonary hygiene.  Optimize fluid balance, nutrition, sleep and glycemic control to maximize infection resistance.  Identify potential sources of infection early to prevent or mitigate progression of infection (e.g., wound, lines, devices).  Evaluate ongoing need for invasive devices; remove promptly when no longer indicated.  Flowsheets (Taken 04/11/2022 2100)  Infection Prevention:   hand hygiene promoted   rest/sleep promoted   single patient room provided  Goal: Optimal Comfort and Wellbeing  Outcome: Ongoing  Intervention: Monitor Pain and Promote Comfort  Description: Assess pain level, treatment efficacy and patient response at regular intervals using a consistent pain scale.  Consider the presence and impact of preexisting chronic pain.  Encourage patient and caregiver involvement in pain assessment, interventions and safety measures.  Flowsheets (Taken 04/11/2022 2100)  Pain Management Interventions:   pillow support provided   position adjusted   quiet environment facilitated  Intervention: Provide Person-Centered Care  Description: Use a family-focused approach to care.  Develop trust and rapport by proactively providing information, encouraging questions, addressing concerns and offering reassurance.  Acknowledge emotional response to  hospitalization.  Recognize and utilize personal coping strategies.  Honor spiritual and cultural preferences.  Flowsheets (Taken 04/11/2022 2100)  Trust Relationship/Rapport:   care explained   choices provided   emotional support provided   empathic listening provided   questions encouraged   questions answered   reassurance provided   thoughts/feelings acknowledged  Goal: Readiness for Transition of Care  Outcome: Ongoing    Problem: Skin Injury Risk Increased  Goal: Skin Health and Integrity  Outcome: Ongoing  Intervention: Optimize Skin Protection  Description: Perform a full pressure injury risk assessment, as indicated by screening, upon admission to care unit.  Reassess skin (injury risk, full inspection) frequently (e.g., scheduled interval, with change in condition) to provide optimal early detection and prevention.  Maintain adequate tissue perfusion (e.g., encourage fluid balance; avoid crossing legs, constrictive clothing or devices) to promote tissue oxygenation.  Maintain head of bed at lowest degree of elevation tolerated, considering medical condition and other restrictions.  Avoid positioning onto an area that remains reddened.  Minimize incontinence and moisture (e.g., toileting schedule; moisture-wicking pad, diaper or incontinence collection device; skin moisture barrier).  Cleanse skin promptly and gently when soiled utilizing a pH-balanced cleanser.  Relieve and redistribute pressure (e.g., scheduled position changes, weight shifts, use of support surface, medical device repositioning, protective dressing application, use of positioning device, microclimate control, use of pressure-injury-monitor  Encourage increased activity, such as sitting in a chair at the bedside or early mobilization, when able to tolerate.  Flowsheets (Taken 04/11/2022 2100)  Pressure Reduction Techniques:   frequent weight shift encouraged   independent turning   tubing/devices free from under/on patient  Head of Bed  (HOB) Positioning: HOB at 30-45 degrees  Pressure Reduction Devices: positioning supports utilized  Skin Protection:   adhesive use limited   incontinence pads utilized   transparent dressing maintained   tubing/devices free from skin contact  Intervention: Promote and Optimize Oral Intake  Description:  Perform a nutrition assessment; include a nutrition-focused physical exam.  Determine calorie, protein, vitamin, mineral and fluid requirements.  Assess for micronutrient deficiencies; supplement if depleted.  Assess need and assist with meal set-up and feeding.  Adjust diet or meal schedule based on preferences and tolerance.  Offer oral supplemental food or drinks to enhance calorie and protein intake.  Establish bowel elimination program to increase comfort and appetite.  Minimize unnecessary dietary restrictions to increase oral intake.  Provide and encourage oral hygiene to enhance desire to eat.  Consider enteral nutrition support if oral intake remains inadequate; provide parenteral nutrition if enteral is contraindicated.  Flowsheets (Taken 04/11/2022 2100)  Oral Nutrition Promotion: rest periods promoted    Problem: Confusion Acute  Goal: Optimal Cognitive Function  Outcome: Ongoing  Intervention: Minimize Contributing Factors  Description: Establish baseline and ongoing mental status using a validated tool.  Investigate potential physiologic factors for confusion (e.g., hypoglycemia, hypoxia, fluid imbalance, sleep deprivation, malnutrition); advocate for and provide treatment.  Minimize stimulation; provide a quiet and calm environment; enlist the help of familiar family or caregiver.  Consider use of complementary and alternative medicine modalities (e.g., therapeutic touch, massage, aromatherapy).  Facilitate safe, barrier-free surroundings; keep needed items within reach (e.g., call light, personal belongings).  Promote use of personal vision and auditory aids.  Provide familiar items and orientation  cues (e.g., calendar, clock, pictures).  Establish familiarity using a structured routine congruent with home schedule, when possible (e.g., consistent caregiver, sleep and meal schedule, toileting schedule).  Use communication techniques that avoid confrontation and correction; approach each interaction as a new episode of engagement.  Flowsheets (Taken 04/11/2022 2100)  Sensory Stimulation Regulation:   auditory stimulation minimized   auditory stimulation provided   lighting decreased   quiet environment promoted  Reorientation Measures:   clock in view   glasses use encouraged   reorientation provided  Environment Familiarity/Consistency:   daily routine followed   familiar objects from home provided   personal clothing/items utilized    Problem: Communication Impairment  Goal: Effective Communication Skills  Outcome: Ongoing  Intervention: Designer, jewellery  Description: Evaluate communication skills with cognitive-communication, speech and language assessments to identify areas of impairment.  Make hearing aids, glasses and dentures available.  Determine optimal means of communication; support multimodal methods and ensure consistent use.  Provide cuing and prompting, as needed, to encourage independence.  Utilize slow, distinct, concise speech with simple verbal and gestured commands as needed; allow adequate time for responses.  Provide a calm environment without excessive noise or distraction; avoid situations overly frustrating for the patient.  Provide therapeutic interventions to address communication impairment, such as auditory comprehension, verbal expression, motor speech and pragmatic language, as well as reading and writing skills.  Flowsheets (Taken 04/11/2022 2100)  Communication Enhancement Strategies: call light answered in person    Electronically signed by Forde Dandy, RN at 04/12/2022  3:19 AM EDT

## (undated) NOTE — Telephone Encounter (Signed)
 Formatting of this note might be different from the original.  Patient's daughter called and is requesting a letter for medical necessity stating the patient needs to stay at [facility] . The adiministratior is Davina Hieatt. She said she needs the letter today.  Electronically signed by Arloa Orlean LABOR, LPN at 90/74/7976 10:41 AM EDT

## (undated) NOTE — Nursing Note (Signed)
Formatting of this note might be different from the original.  Patient awake upon entering room. Patient refused to have pull- up checked or changed. Offered to take patient to the bathroom or use the bedside commode, and patient refused. Patient states, " no, I'm fine." Side rails up X2, bed down in lowest position, and call bell in reach. Bed alarm on and in use. Will continue to monitor.   Electronically signed by Forde Dandy, RN at 04/12/2022  6:09 AM EDT

## (undated) NOTE — Progress Notes (Signed)
Formatting of this note might be different from the original.  Pt leaving floor at this time. All belongings taken by daughter and pt. No complaints voiced. No acute distress noted at this time. TO T transporting.   Electronically signed by Sallye Ober, RN at 04/16/2022  6:01 PM EDT

## (undated) NOTE — Progress Notes (Signed)
Formatting of this note is different from the original.  Medical Nutrition Therapy Assessment  Chart reviewed for MST Score 2: Unsure of unintentional wt loss.     Recommendations:   - Continue current diet.    Estimated Nutrition Needs (using wt of 105 kg):  Kcals: 2100 kcals (20 kcals/kg)  Protein: 84 gm (0.8 gm/kg)  Fluid: 30m/kcal or per provider  Energy Intake:   - PO intake: 0% on DIET REGULAR Continuous   Physical Findings:   Wt Hx:      Treatment Plan  '[]'$  Liberalize diet  '[]'$  Oral nutrition supplements  '[]'$  Vitamin / mineral supplementation  '[]'$  Tube feeding  '[]'$  Parenteral nutrition  '[x]'$  Continue current nutrition plan of care  '[]'$  Other -      Anthropometrics:  Ht Readings from Last 1 Encounters:   04/11/22 1.676 m ('5\' 6"'$ )     Wt Readings from Last 1 Encounters:   04/11/22 104.8 kg (231 lb)     IBW:                           Body mass index is 37.28 kg/m. - obese (class ll)     35.0-39.9  %Ideal wt: 178%    Wt Readings from Last 5 Encounters:   04/11/22 104.8 kg (231 lb)   03/24/22 108.4 kg (239 lb)   03/14/22 108.6 kg (239 lb 8 oz)   03/07/22 108.4 kg (239 lb)   03/04/22 108.4 kg (239 lb)     Past Medical/Surgical Hx:     Past Medical History:   Diagnosis Date   ? Acute bronchitis 11/26/2013   ? Acute gastritis 05/29/2015   ? Acute pharyngitis 11/26/2013   ? Anxiety disorder    ? Arthritis    ? Bilious vomiting with nausea 05/29/2015   ? Breast cancer ([facility]     Sees Dr SEnriqueta Shutter  ? Chronic back pain    ? Deep vein thrombosis (DVT) (HCC)    ? Dehydration 05/29/2015   ? Dyslipidemia    ? Factor V Leiden    ? Fatty liver 04/02/2018   ? Fibromyalgia    ? GERD (gastroesophageal reflux disease)    ? Glaucoma    ? H/O total mastectomy of right breast 03/24/2020   ? [facility]atic encephalopathy [place] 05/26/2016    Secondary to medication withdrawal   ? Hypothyroidism    ? Migraines    ? Peptic ulcer    ? [place]/23[facility]   ? Shoulder fracture, right    ? Sinusitis 11/26/2013   ? Sleep apnea        Past Surgical History:    Procedure Laterality Date   ? HX CESAREAN SECTION     ? HX MODIFIED RADICAL MASTECTOMY Right     with lymph node removal   ? MASTECTOMY, PARTIAL Right    ? MASTECTOMY, SIMPLE, COMPLETE Right      Pertinent Medications:   Current Facility-Administered Medications   Medication Indication(s) Dose Route Frequency   ? atorvastatin (LIPITOR) tablet 20 mg    20 mg Oral DAILY   ? divalproex (DEPAKOTE SPRINKLE) capsule 250 mg    250 mg Oral BID   ? DULoxetine (CYMBALTA) capsule 60 mg    60 mg Oral DAILY   ? famotidine (PEPCID) tablet 20 mg    20 mg Oral BID   ? levothyroxine (SYNTHROID) tablet 50 mcg    50 mcg Oral DAILY   ?  pantoprazole (PROTONIX) tablet 40 mg    40 mg Oral DAILY   ? warfarin (COUMADIN) tablet 3 mg    3 mg Oral DAILY   ? D5W Adult Carrier Fluid    30 mL Intravenous PRN   ? NS Adult Carrier Fluid    30 mL Intravenous PRN   ? acetaminophen (TYLENOL) tablet 650 mg    650 mg Oral Q6H PRN    Or   ? acetaminophen (TYLENOL) suppository 650 mg    650 mg Rectal Q6H PRN   ? melatonin oral tablet 3 mg    3 mg Oral QHS PRN   ? lactulose (CHRONULAC) solution 30 g    30 g Oral BID   ? [START ON 04/12/2022] influenza quadrivalent vaccine 2023-24 (FLULAVAL) 60 mcg-0.5 mL IM syringe    0.5 mL IntraMUSCULAR ONCE   ? ondansetron (ZOFRAN) injection 4 mg    4 mg Intravenous Q8H PRN   ? pregabalin (LYRICA) capsule 150 mg    150 mg Oral TID     Pertinent Labs: K 3.4, GFR 71    Allergies   Allergen Reactions   ? Oxycodone Other - See Comments   ? Azithromycin Diarrhea     I/O:     Intake/Output Summary (Last 24 hours) at 04/11/2022 2215  Last data filed at 04/11/2022 1800  Gross per 24 hour   Intake 630 ml   Output --   Net 630 ml     Skin:     Braden: 19    Assessment  Inadequate intake x 1 day due to confusion and picking at food.    Goals: Intake >50% of most meals over next 5 days, BM every 1-3 days, nutrition-related labs WNL, no significant wt loss during LOS, wound healing/preserved skin integrity, optimize blood glucose  control, adequate hydration.     Pt is at moderate nutrition risk - RD will f/u every 3-5 days. Consult prn.    Skip Mayer, RD  Phone #: [phone]    Electronically signed by Skip Mayer, RD at 04/11/2022 10:29 PM EDT

## (undated) NOTE — Consults (Signed)
Associated Order(s): CONSULT TO PHARMACY ([facility])  Formatting of this note is different from the original.  Images from the original note were not included.    Department of Pharmacy    Pharmacy Progress Note  Warfarin Dosing Consult     Subjective/Objective     Kelly Harrison is a 16 y.o. female who is receiving warfarin for history of DVT     Reported home warfarin regimen: '7mg'$     (Source: [facility])    Goal INR is 2-3 with order to hold warfarin for INR 3.5 per Prescriber.    Height: 167.6 cm ('5\' 6"'$ )  Weight: 104.8 kg (231 lb)    Recent Labs     04/12/22  0540 04/11/22  0515   HGB 11.7* 12.3   HCT 36.7 36.6     Warfarin Dosing History During this Admission    Date INR Dose  04/12/22 2.1 '3mg'$       Pertinent Drug Interactions / Anticoagulation Therapy:     Significant drug-drug interactions:  none  Bridging/overlap therapy: no    Assessment/Plan:     1. Today's warfarin dose: '3mg'$     1. Current regimen: '3mg'$   2. Obtain INR daily.     Please call Pharmacy with any questions.    Thank you,  Everardo Pacific, PharmD  Electronically signed by Angelica Pou, PharmD at 04/12/2022  5:00 PM EDT

## (undated) NOTE — Progress Notes (Signed)
Formatting of this note might be different from the original.    Problem: Infection  Goal: Absence of Infection Signs and Symptoms  Outcome: Ongoing  Intervention: Prevent or Manage Infection  Flowsheets (Taken 04/14/2022 2026)  Infection Management: aseptic technique maintained  Fever Reduction/Comfort Measures:   lightweight bedding   lightweight clothing    Problem: Adult Inpatient Plan of Care  Goal: Plan of Care Review  Outcome: Ongoing  Flowsheets (Taken 04/12/2022 0315 by Forde Dandy, RN)  Progress: no change  Plan of Care Reviewed With: patient  Goal: Patient-Specific Goal (Individualized)  Outcome: Ongoing  Goal: Absence of Hospital-Acquired Illness or Injury  Outcome: Ongoing  Intervention: Identify and Manage Fall Risk  Flowsheets (Taken 04/14/2022 2026)  Safety Promotion/Fall Prevention: activity supervised  Intervention: Prevent Skin Injury  Flowsheets (Taken 04/14/2022 2026)  Body Position: turns independently  Skin Protection:   adhesive use limited   transparent dressing maintained   tubing/devices free from skin contact  Intervention: Prevent and Manage VTE (Venous Thromboembolism) Risk  Flowsheets (Taken 04/14/2022 2026)  Activity Management: activity encouraged  VTE Prevention/Management: patient refused mechanical DVT prophylaxis, patient informed of risks  Range of Motion: active ROM (range of motion) encouraged  Intervention: Prevent Infection  Flowsheets (Taken 04/14/2022 2026)  Infection Prevention:   personal protective equipment utilized   hand hygiene promoted  Goal: Optimal Comfort and Wellbeing  Outcome: Ongoing  Intervention: Monitor Pain and Promote Comfort  Flowsheets (Taken 04/14/2022 1000 by Alger Memos, RN)  Pain Management Interventions:   care clustered   pillow support provided  Intervention: Provide Arnold Line (Taken 04/14/2022 2026)  Trust Relationship/Rapport: care explained  Goal: Readiness for Transition of Care  Outcome: Ongoing  Intervention:  Mutually Develop Transition Plan  Flowsheets (Taken 04/12/2022 1707 by Bernadene Person, PT, DPT)  Equipment Currently Used at Home: (Pt reports she has a walker; raised toilet seat; showever chair, however poor historian) --    Problem: Skin Injury Risk Increased  Goal: Skin Health and Integrity  Outcome: Ongoing  Intervention: Optimize Skin Protection  Flowsheets  Taken 04/14/2022 2026 by Carles Collet, RN  Pressure Reduction Techniques:   frequent weight shift encouraged   turned/repositioned   weight shift assistance provided  Head of Bed (HOB) Positioning: HOB at 30-45 degrees  Skin Protection:   adhesive use limited   transparent dressing maintained   tubing/devices free from skin contact  Taken 04/14/2022 1000 by Alger Memos, RN  Pressure Reduction Devices: specialty bed utilized  Intervention: Promote and Optimize Oral Intake  Flowsheets (Taken 04/14/2022 2026)  Oral Nutrition Promotion: rest periods promoted    Problem: Confusion Acute  Goal: Optimal Cognitive Function  Outcome: Ongoing  Intervention: Minimize Contributing Factors  Flowsheets (Taken 04/14/2022 2026)  Sensory Stimulation Regulation:   care clustered   lighting decreased   television on  Reorientation Measures:   clock in view   reorientation provided  Environment Familiarity/Consistency: familiar objects from home provided    Problem: Communication Impairment  Goal: Effective Communication Skills  Outcome: Ongoing  Intervention: Designer, jewellery  Flowsheets (Taken 04/14/2022 2026)  Communication Enhancement Strategies: call light answered in person    Electronically signed by Carles Collet, RN at 04/14/2022  9:22 PM EDT
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
## (undated) NOTE — Nursing Note (Signed)
Formatting of this note might be different from the original.  [facility] to transport ETA 30-56mn  Electronically signed by CKy Barban RN at 04/16/2022  4:42 PM EDT
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
## (undated) NOTE — Progress Notes (Signed)
Formatting of this note might be different from the original.  INPATIENT PHYSICAL THERAPY TREATMENT NOTE  Treatment Date:  04/16/2022    Treatment Start/Stop Time: 9:00 AM - 9:29 AM = 29 minutes       S:  Pt didn't have any complaints and was compliant with PT this date.   O:  Chart reviewed.  General Observations:  Pt in bed on arrival.    Mobility: Bed Mobility: Supine to sit: Min Assist, instructional cueing, extra time  Sit to supine: Stand by assist, instructional cueing, extra time  Bridging/Scooting: Min Assist, instructional cueing, extra time  Transfers: Sit to stand: [place], instructional cueing, extra time, set-up from bed to [place] to sit: Contact guard, instructional cueing, extra time  Gait: Gait details: Pt ambulated approximately 15 ft with a FWW and therapist providing CGA with a gait belt. After a seated rest break the pt was again able to ambulate 15 ft with a FWW and therapist providing CGA with a gait belt.     ROM/Therapeutic Exercise: ankle pumps , long arc quads  and seated march, 10 reps ea  Pain (NPRS):  Location: Pt didn't report any complaints of pain.  Rest: 0/10  Activity: 0 /10       Patient/Caregiver Education: Educated pt on transferring from seated to standing position and vice versa with the proper technique. Also educated pt on ambulating safely and with the proper technique, especially when performing turns with FWW.  Interdisciplinary Contacts: Discussed patient status with nurse.   Patient left in bed with alarm re-activated, items in reach, lines intact and nurse aware    A:  Pt continues to meet criteria for skilled intervention during this admission. Pt ambulated approximately 15 ft with a FWW and therapist providing CGA with a gait belt. After a seated rest break the pt was again able to ambulate 15 ft with a FWW and therapist providing CGA with a gait belt. Pt required verbal cueing throughout the treatment session in order to remain on task and perform transfers as  well as ambulation safely and with the proper technique.     P:  Continue current plan of care 4-5/wk with focus on progressive mobility.    Therapist: Honor Junes, PTA         Date: 04/16/2022  Electronically signed by Honor Junes, PTA at 04/16/2022  9:55 AM EDT
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
## 1987-05-18 ENCOUNTER — Other Ambulatory Visit (HOSPITAL_COMMUNITY): Payer: Self-pay | Admitting: OBSTETRICS/GYNECOLOGY

## 2018-05-28 IMAGING — US ABD COMPLETE
1 series · 14 of 25 positions shown · non-contrast
Comparison: None available.

EXAM:  [facility] PROFESSIONAL READ ABD U/S COMPLETE
INDICATION: Hepatitis-B.

[Series 1: abd complete · 14 of 83 slices shown]
[im 1/83]
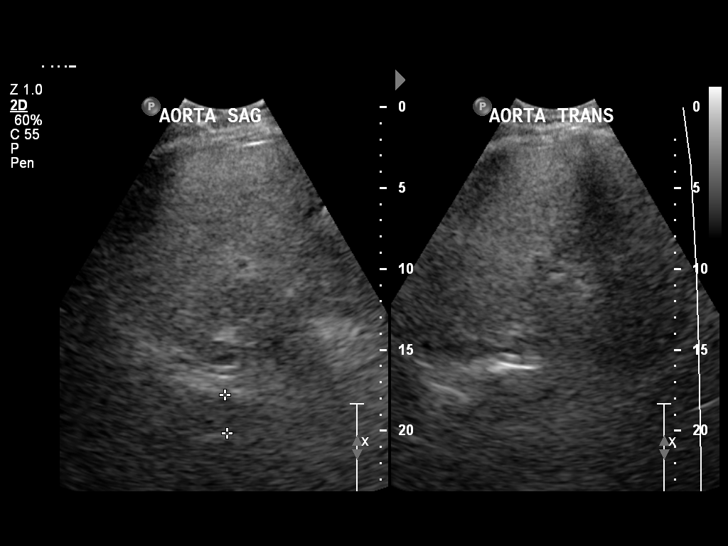
[im 7/83]
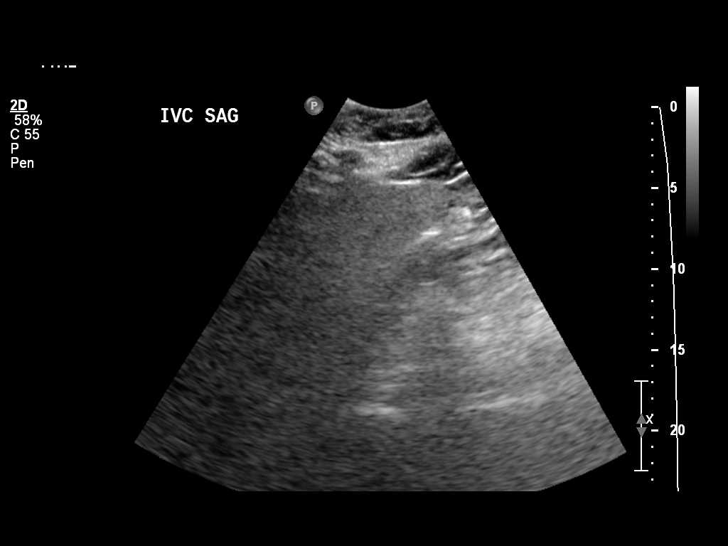
[im 14/83]
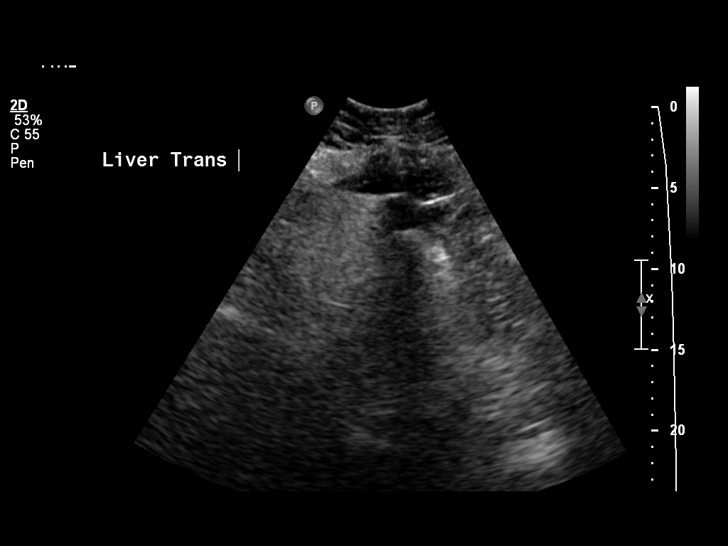
[im 21/83]
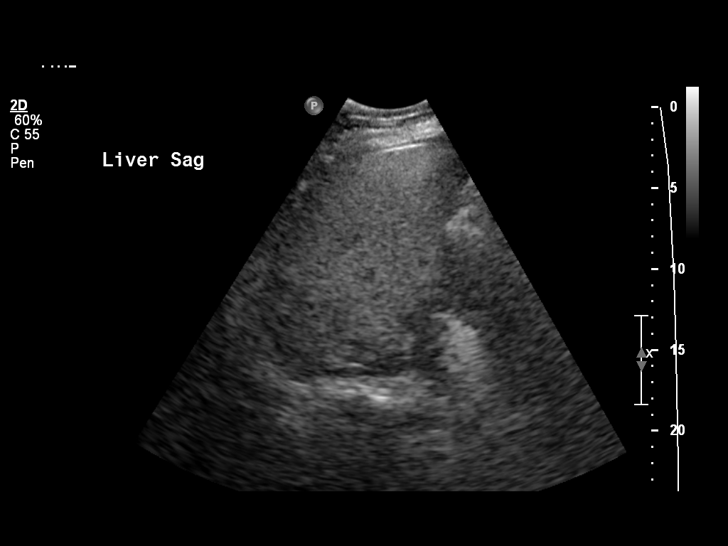
[im 28/83]
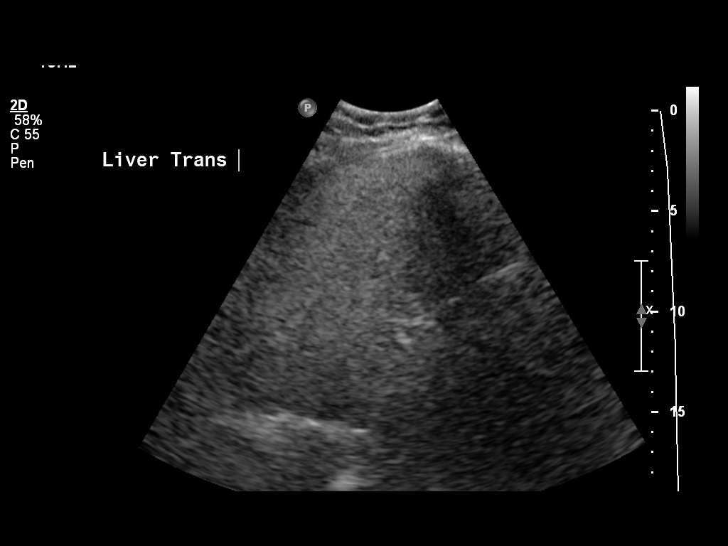
[im 31/83]
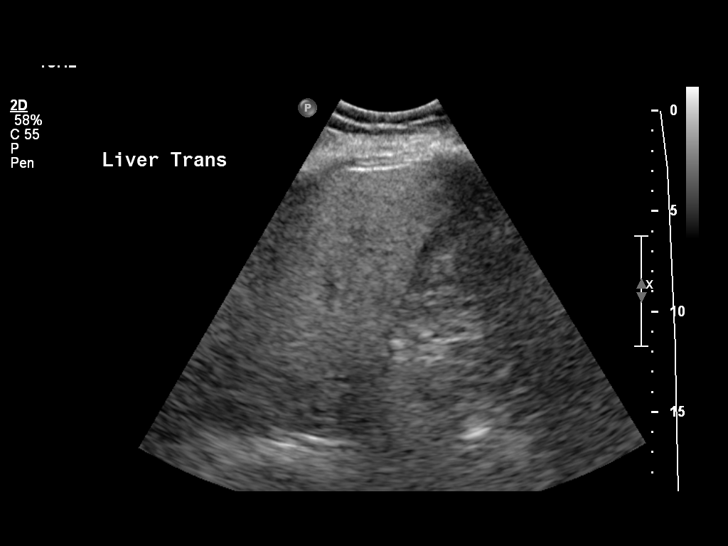
[im 38/83]
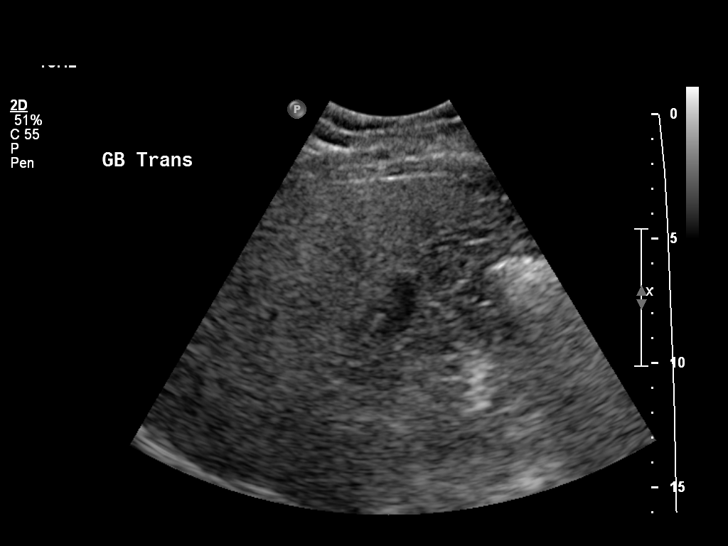
[im 45/83]
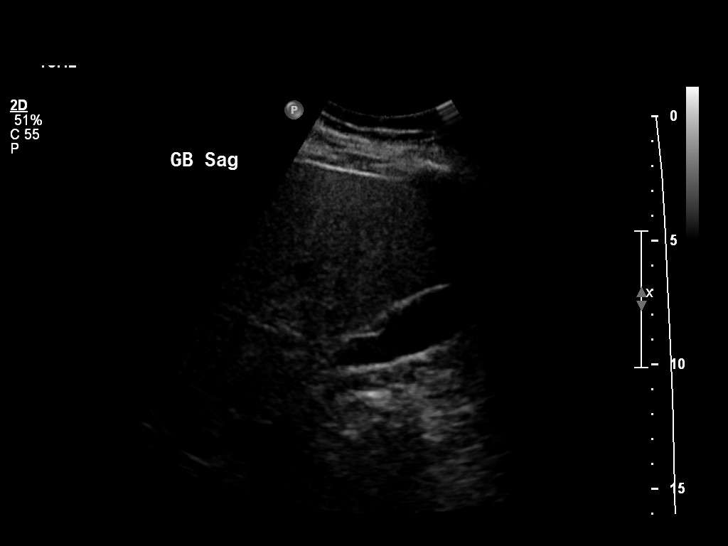
[im 52/83]
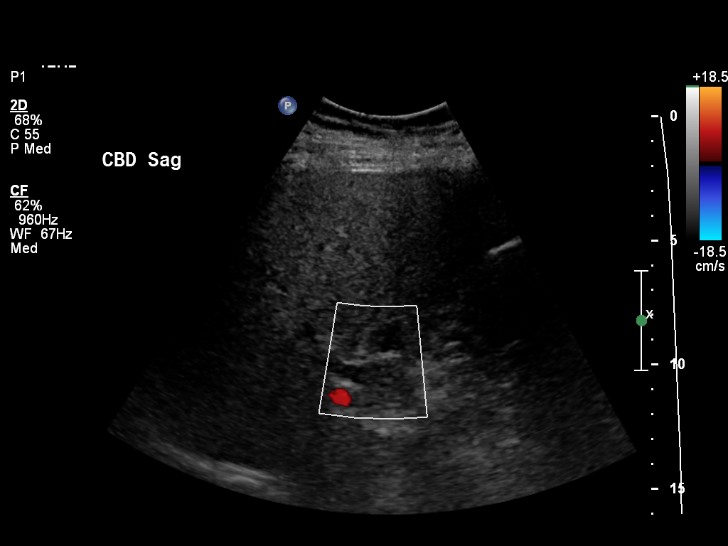
[im 55/83]
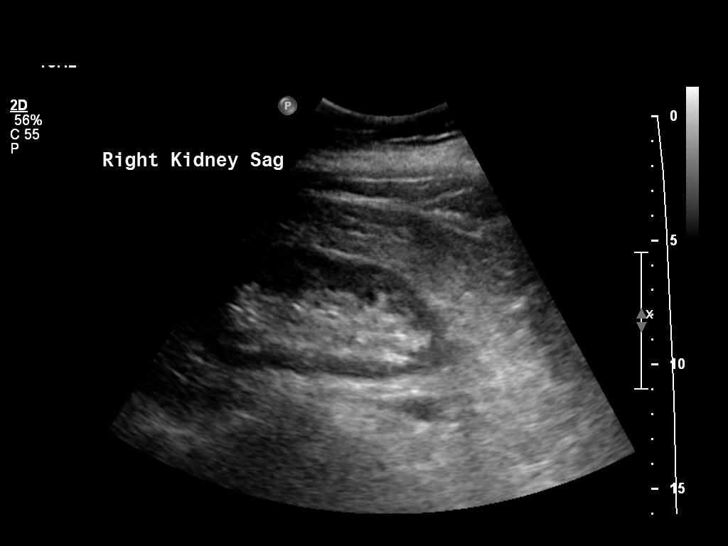
[im 62/83]
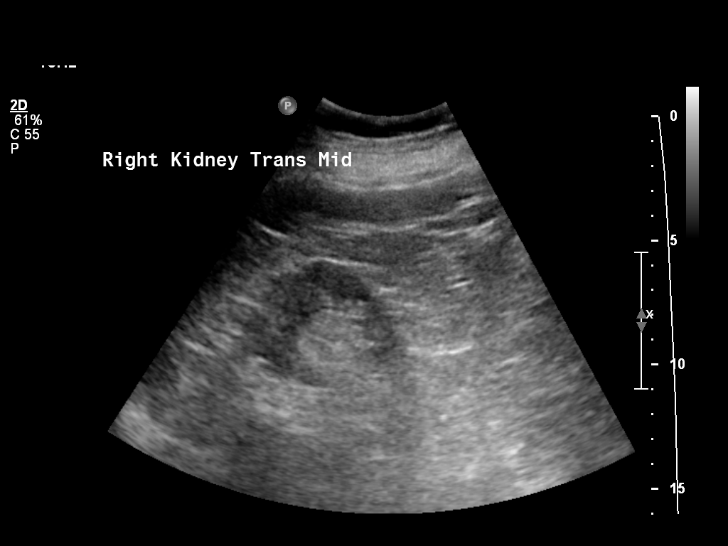
[im 69/83]
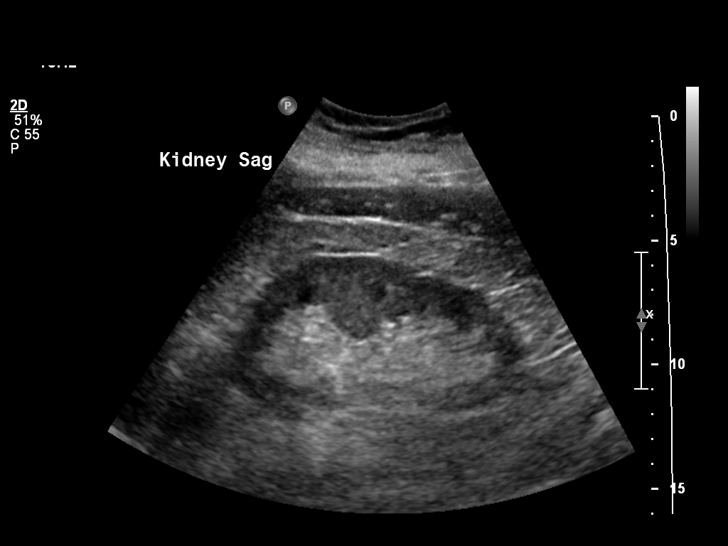
[im 76/83]
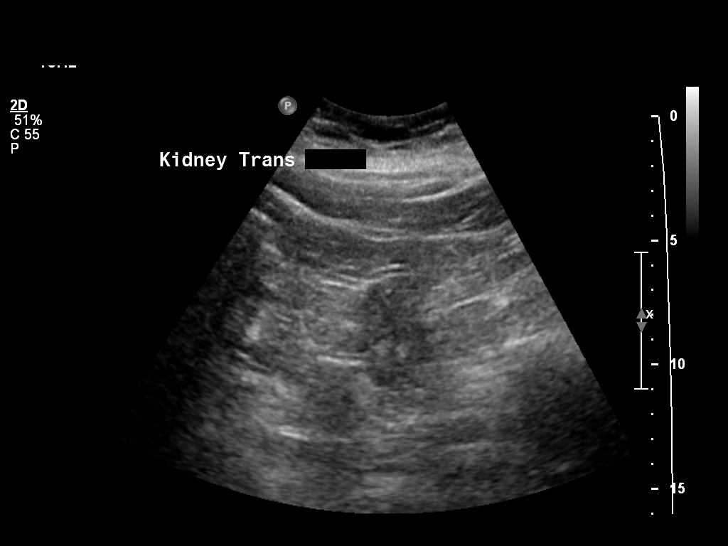
[im 83/83]
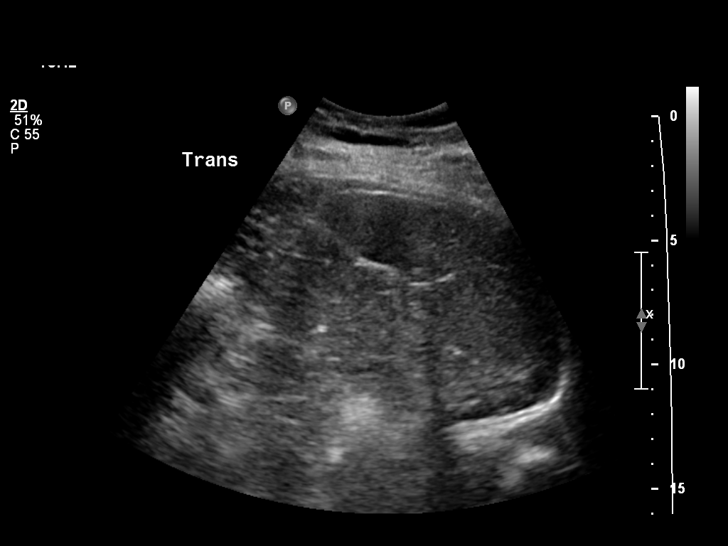

[14 of 25 positions shown; findings below may reference images not displayed]

FINDINGS: Liver is fatty and enlarged measuring 19 cm in maximum sagittal dimension. Fatty infiltration limits evaluation for focal hepatic mass. There is no intra or extrahepatic biliary ductal dilatation. Common bile duct measures 6 mm. There are small gallbladder polyps. No sludge or shadowing gallstones are seen. There is no gallbladder wall thickening or pericholecystic fluid. Pancreas is incompletely visualized due to artifact from overlying bowel gas. Spleen measures 11 cm and is unremarkable.

Kidneys are normal in echogenicity and measure 12 cm bilaterally. There is no hydronephrosis, mass or cyst on either side.

Visualized abdominal aorta is without aneurysmal dilatation. IVC is normal. Portal vein measures 8 mm in diameter and demonstrates hepatopetal flow. Hepatic veins are also patent. There is no ascites.
IMPRESSION: 1. Fatty and enlarged liver. 

2. Small gallbladder polyps. No evidence of cholelithiasis or acute cholecystitis. 

3. Pancreas incompletely visualized due to artifact from overlying bowel gas.

## 2018-09-03 IMAGING — US ABD COMPLETE
1 series · 14 of 25 positions shown · non-contrast
Comparison: 05/28/2018.

EXAM:  [facility]ST[facility] PROFESSIONAL READ ABD U/S COMPLETE
INDICATION: B18.1.

[Series 1: abd complete · 14 of 64 slices shown]
[im 1/64]
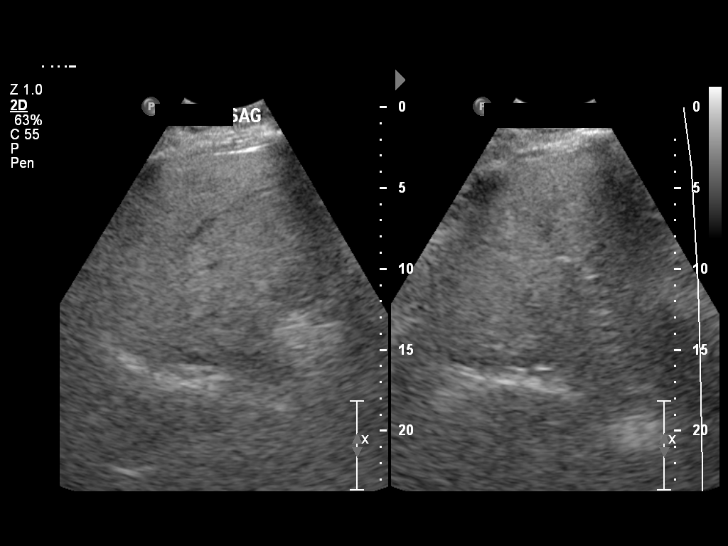
[im 6/64]
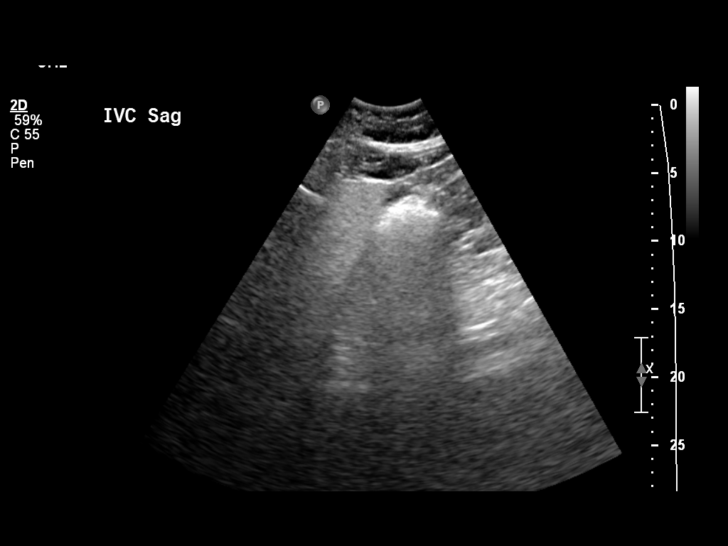
[im 11/64]
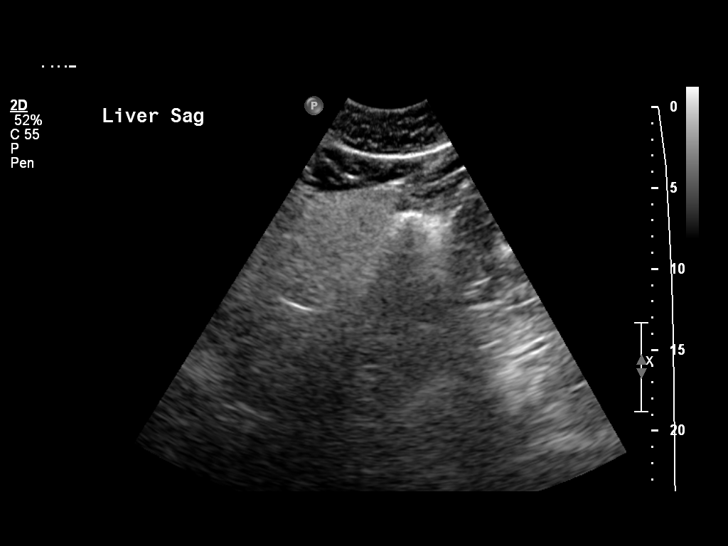
[im 16/64]
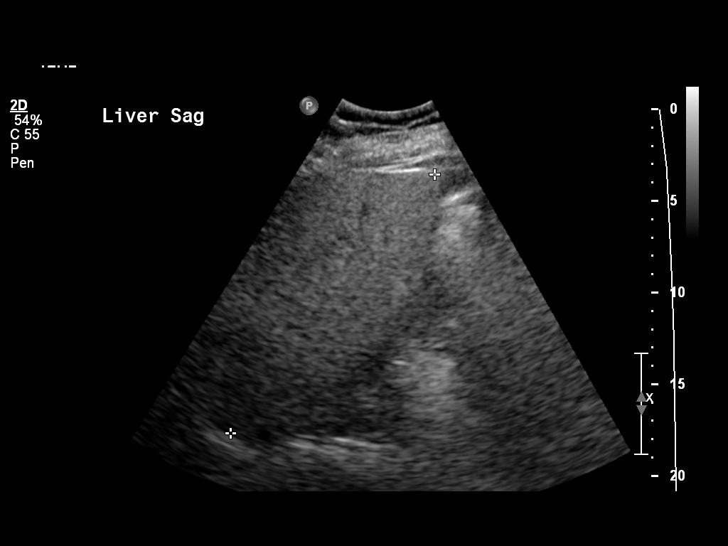
[im 22/64]
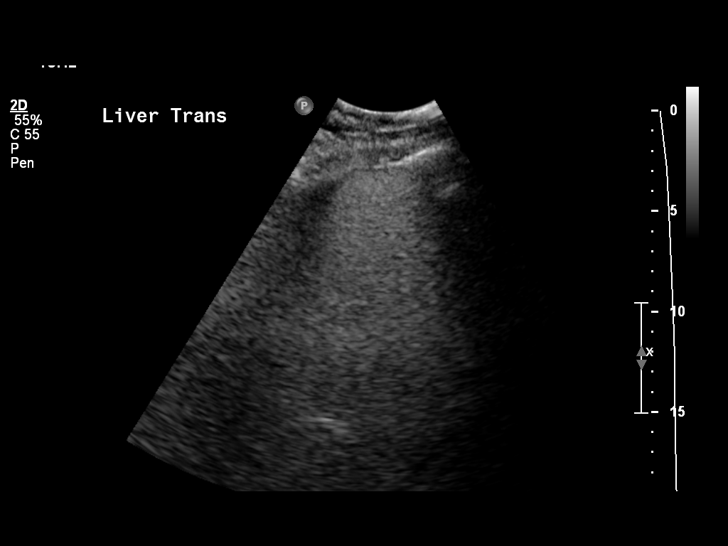
[im 24/64]
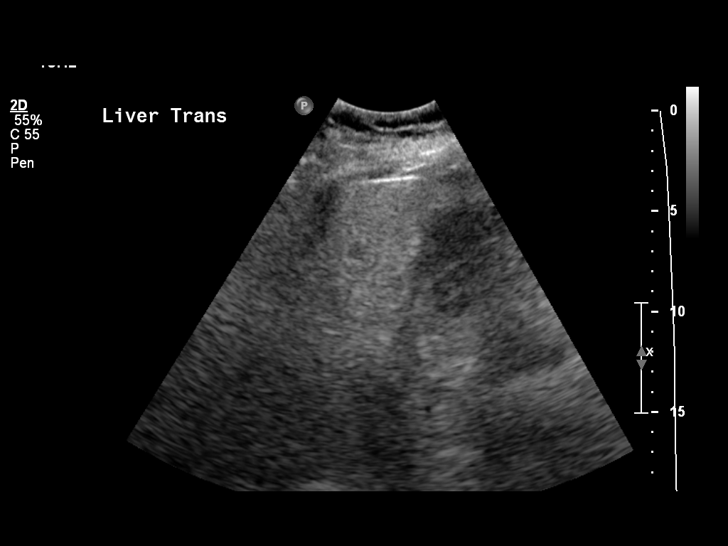
[im 29/64]
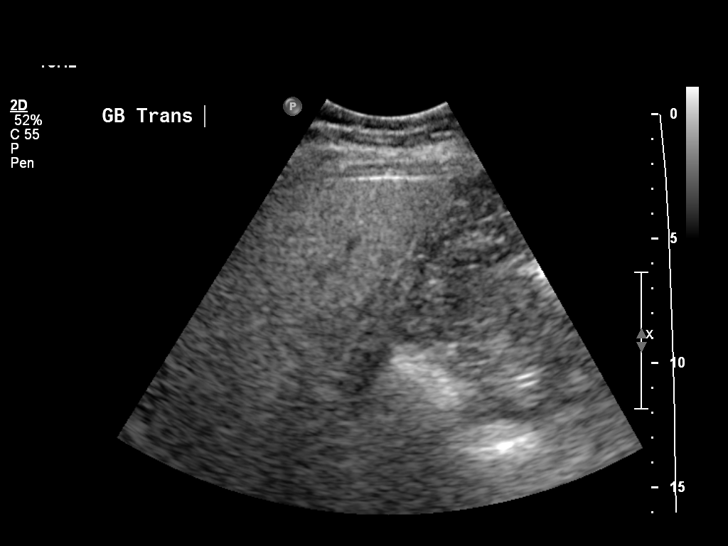
[im 35/64]
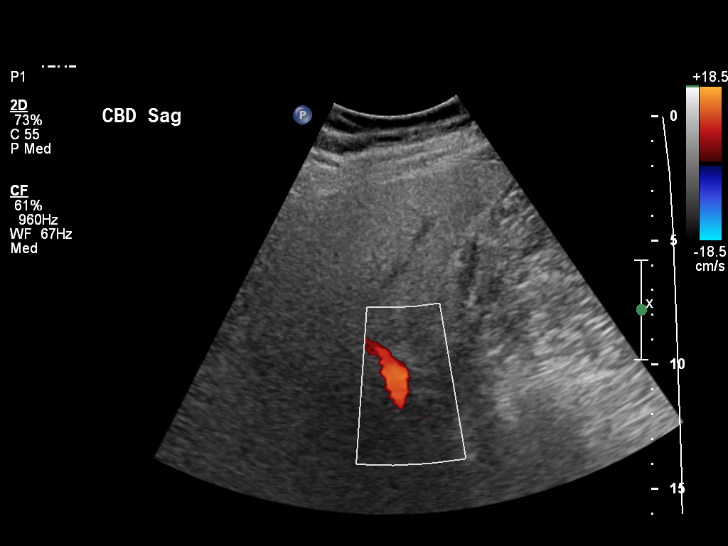
[im 40/64]
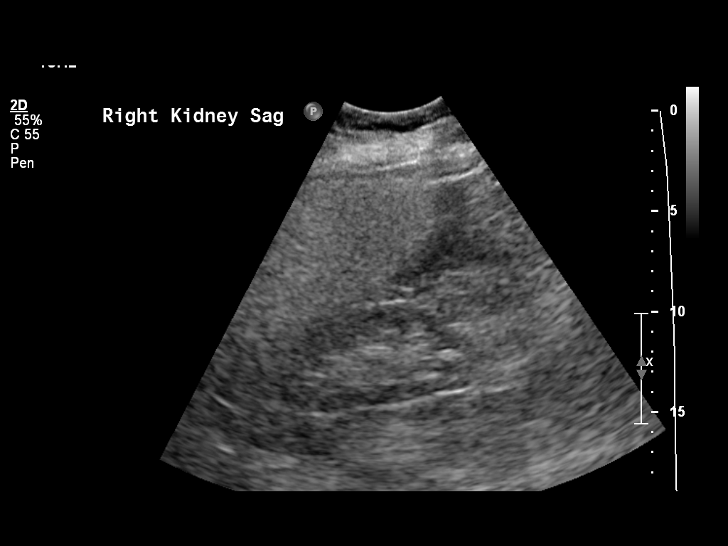
[im 43/64]
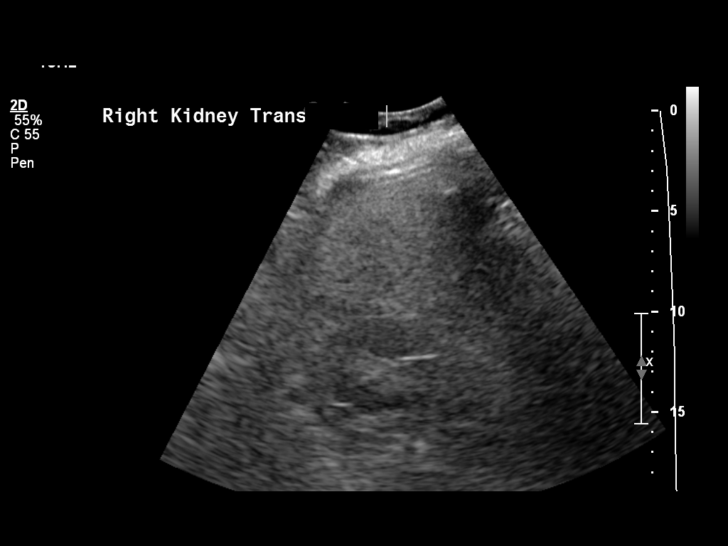
[im 48/64]
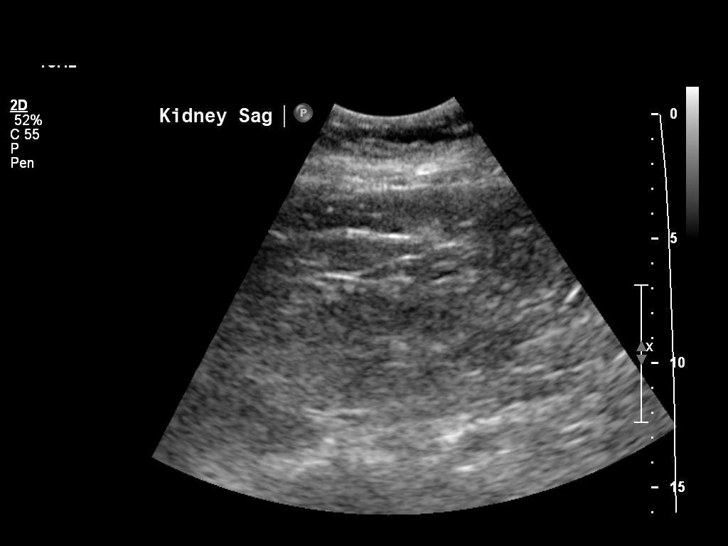
[im 53/64]
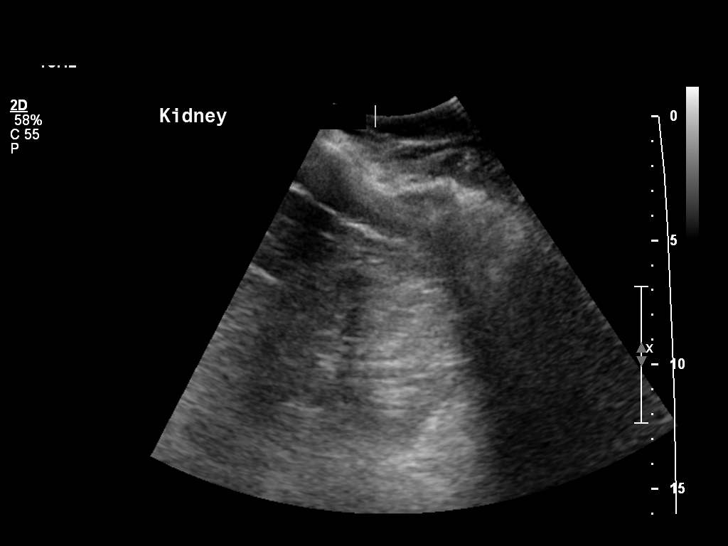
[im 58/64]
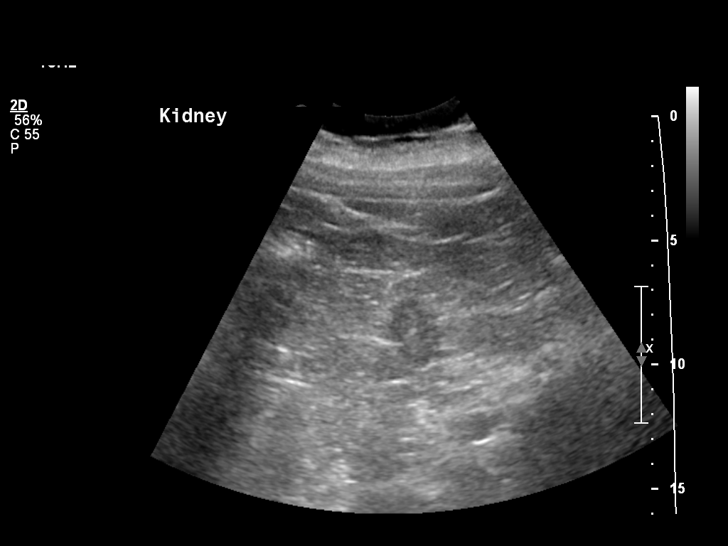
[im 64/64]
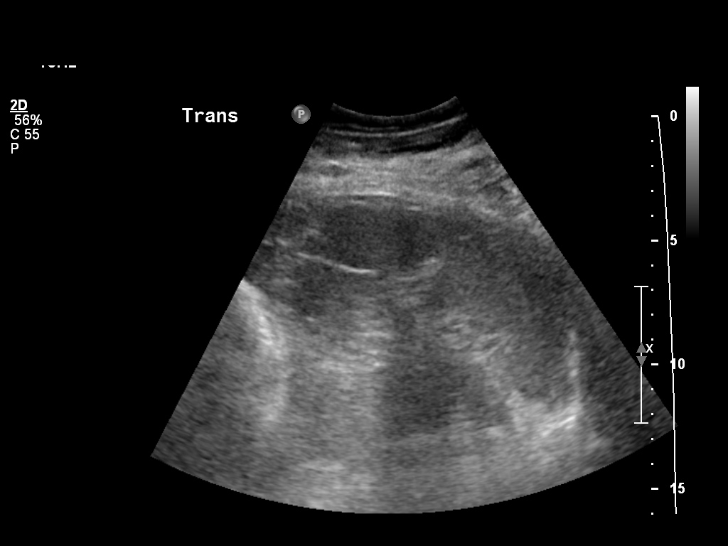

[14 of 25 positions shown; findings below may reference images not displayed]

FINDINGS: Liver is fatty and borderline enlarged measuring 18 cm in maximum sagittal dimension. Fatty infiltration limits evaluation for focal hepatic mass. There is no intra or extrahepatic biliary ductal dilatation. Common bile duct measures 3 mm. Gallbladder is contracted. No sludge or shadowing gallstones are seen. There is no pericholecystic fluid. Pancreas is incompletely visualized due to artifact from overlying bowel gas. Spleen measures 11.5 cm and is unremarkable.

Kidneys are normal in echogenicity. Right kidney measures 10.5 cm and left kidney measures 12 cm. There is no hydronephrosis, mass or cyst on either side.

Visualized abdominal aorta is without aneurysmal dilatation. IVC is normal. Portal vein measures 9 mm in diameter and demonstrates hepatopetal flow. Hepatic veins are also patent. There is no ascites.
IMPRESSION: 1. Fatty and borderline enlarged liver. 

2. No evidence of cholelithiasis or acute cholecystitis. 

3. Pancreas incompletely visualized due to artifact from overlying bowel gas.

## 2018-10-06 IMAGING — CT CT ABDOMEN&PELVIS WITHOUT AND WITH CONTRAST
2 of 5 series · 15 of 46 positions shown, 17 images · IV contrast (CONTRAST)
Comparison: Ultrasound abdomen dated 09/03/2018.

EXAM:  CT ABDOMEN&PELVIS WITHOUT AND WITH CONTRAST
INDICATION: Right-sided abdominal pain.
TECHNIQUE: Axial CT imaging of the abdomen and pelvis was performed without and with 100 mL of Optiray 300. Oral contrast was also administered. Images were reviewed in multiple windows and projections. Exam was performed using 1 or more of the following dose reduction techniques: Automated exposure control, adjustment of the mA and/or kV according to patient size, or the use of iterative reconstruction technique.

[ax post · axial · 0.81mm/px · z∈[-151,+289]mm · 12 of 262 slices shown, 14 images]
[im 21/262  soft-tissue]
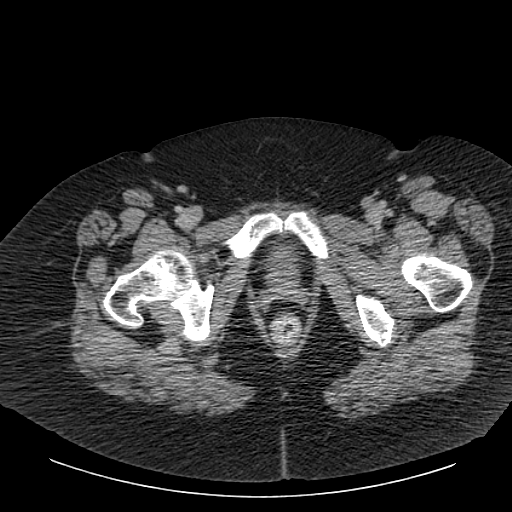
[im 21/262  bone]
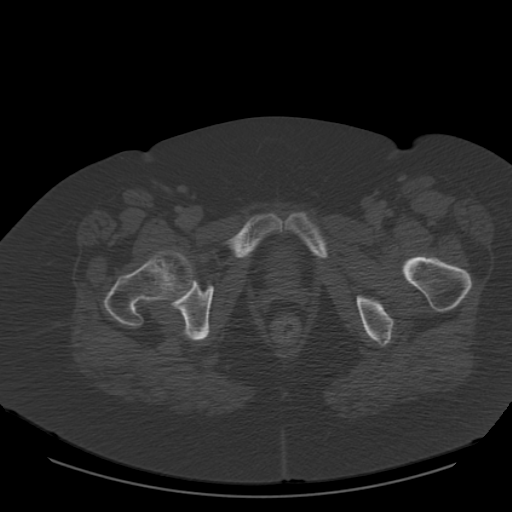
[im 41/262  soft-tissue]
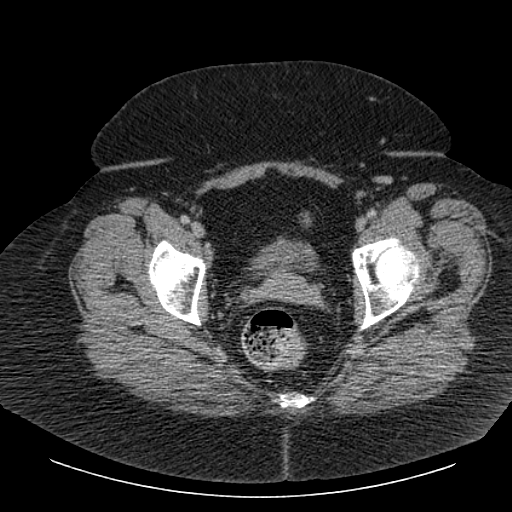
[im 61/262  soft-tissue]
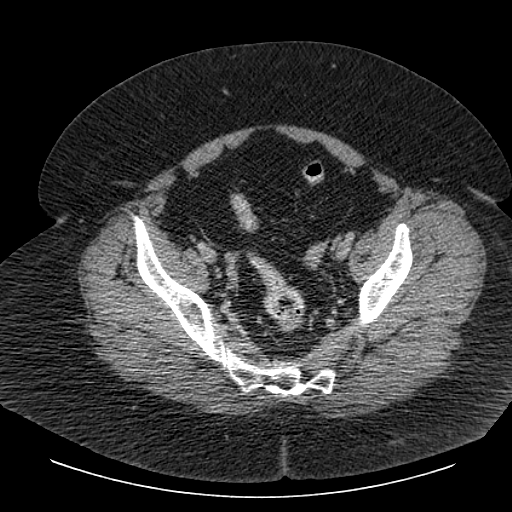
[im 81/262  soft-tissue]
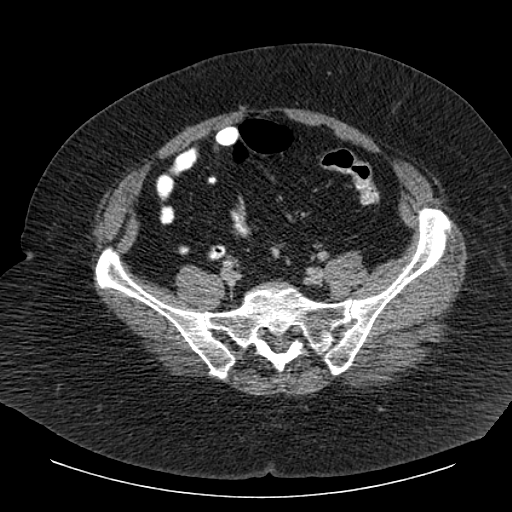
[im 101/262  soft-tissue]
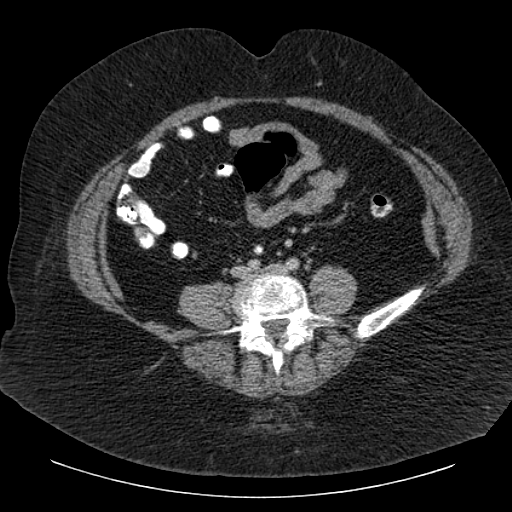
[im 121/262  soft-tissue]
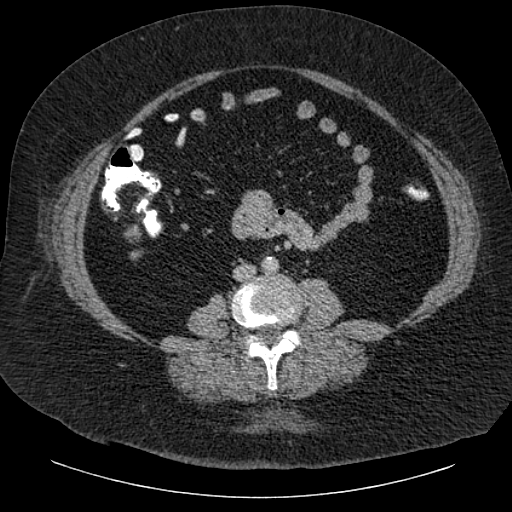
[im 141/262  soft-tissue]
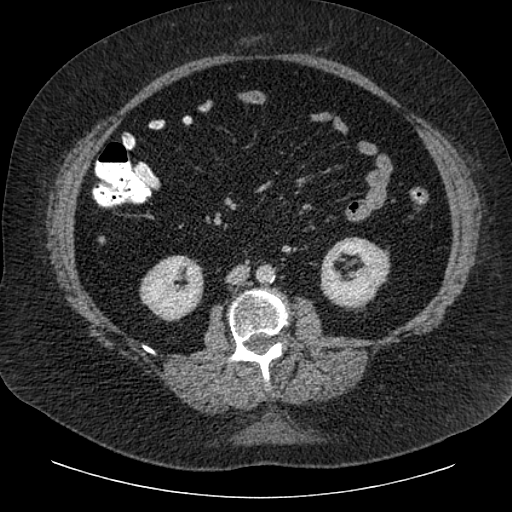
[im 161/262  soft-tissue]
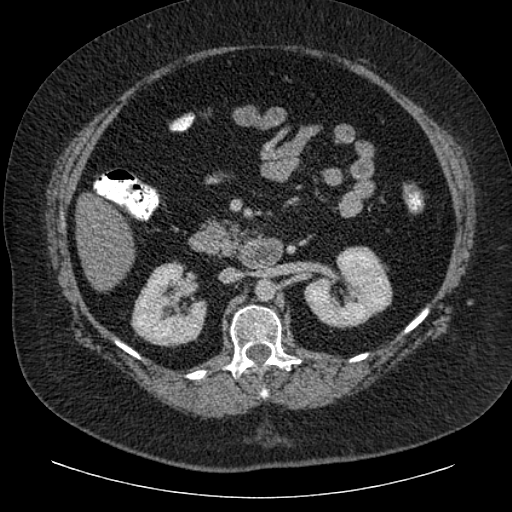
[im 181/262  soft-tissue]
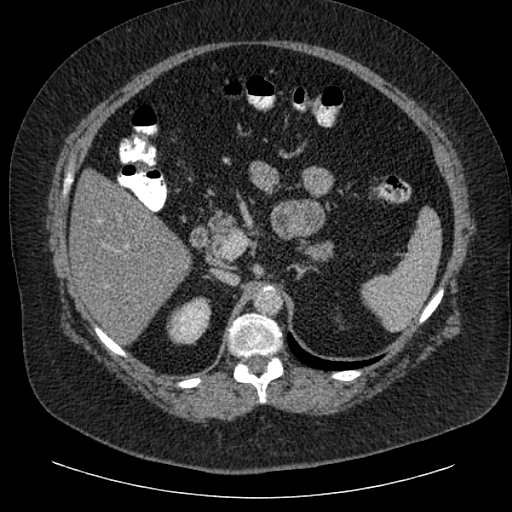
[im 181/262  bone]
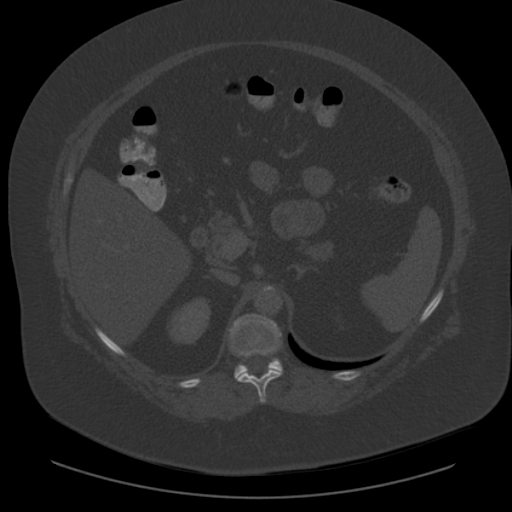
[im 201/262  soft-tissue]
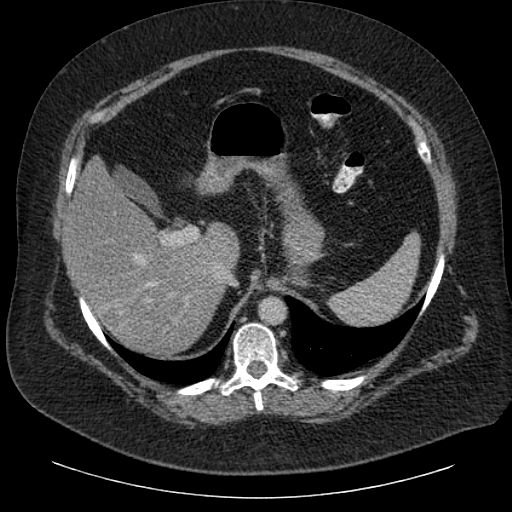
[im 221/262  soft-tissue]
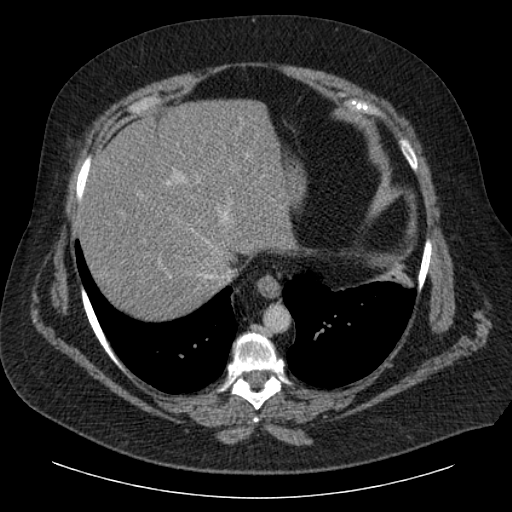
[im 241/262  soft-tissue]
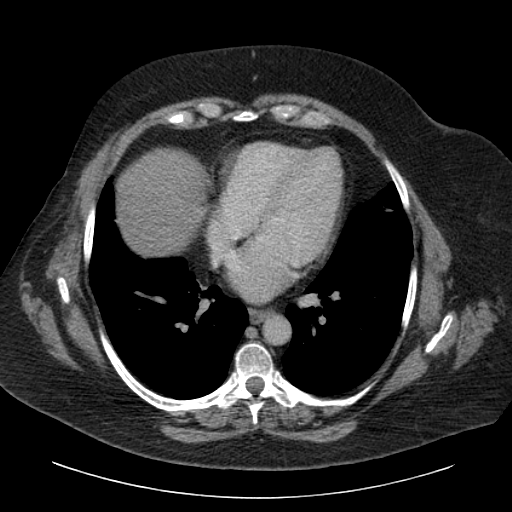

[cor · coronal · 1.09mm/px · 3 of 191 slices shown]
[im 64/191  soft-tissue]
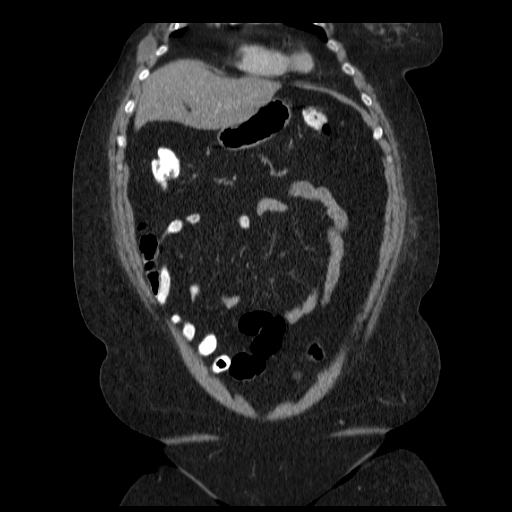
[im 85/191  soft-tissue]
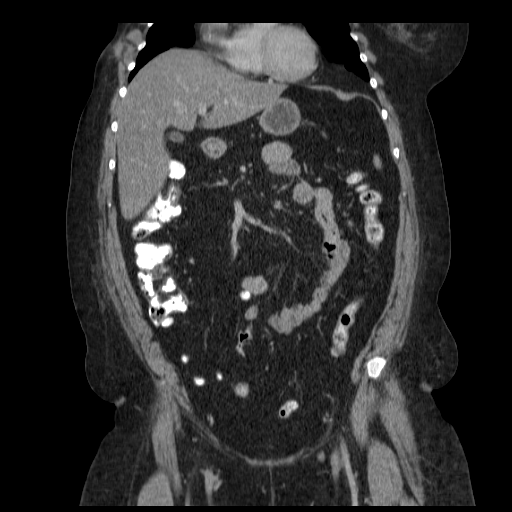
[im 106/191  soft-tissue]
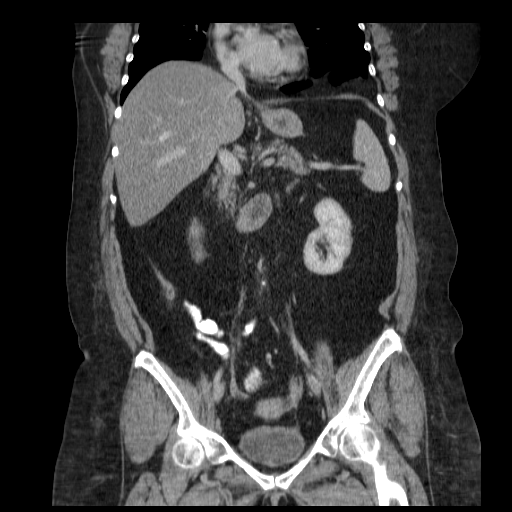

[15 of 46 positions shown; findings below may reference images not displayed]

FINDINGS: Abdomen

Limited visualization of lung bases is unremarkable. There is no pleural or pericardial effusion.

Liver is diffusely fatty and enlarged, measuring 20.5 cm in maximum craniocaudal dimension. There is suggestion of a 2 cm probable hemangioma within the right hepatic lobe posterior segment. Gallbladder, spleen, pancreas, adrenal glands and kidneys are normal. Bowel loops are normal in course and caliber, there is no obstruction or free air. There is no adenopathy. There are scattered vascular calcifications.

Pelvis

There is no acute inflammatory process. Appendix is normal. There is no pelvic free fluid. There is no adenopathy. Osseous structures are unremarkable.
IMPRESSION: No acute abnormality within the upper abdomen and pelvis. 

Fatty and enlarged liver with suggestion of a 2 cm probable hemangioma within the right hepatic lobe posterior segment.

## 2019-06-19 IMAGING — US ABD LIMITED
1 series · 14 of 25 positions shown · non-contrast
Comparison: CT abdomen pelvis dated 10/06/2018.

EXAM:  JOSAN PROFESSIONAL READ ABD U/S LMTD
INDICATION: Fatty liver.

[Series 1: abd limited · 14 of 57 slices shown]
[im 1/57]
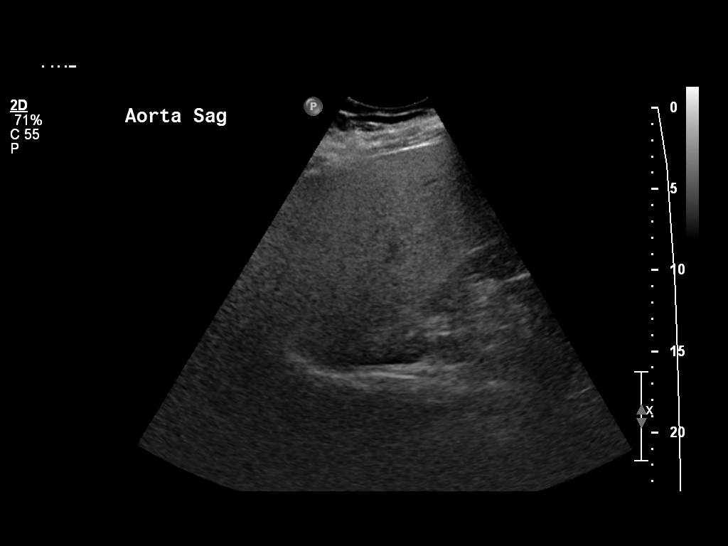
[im 5/57]
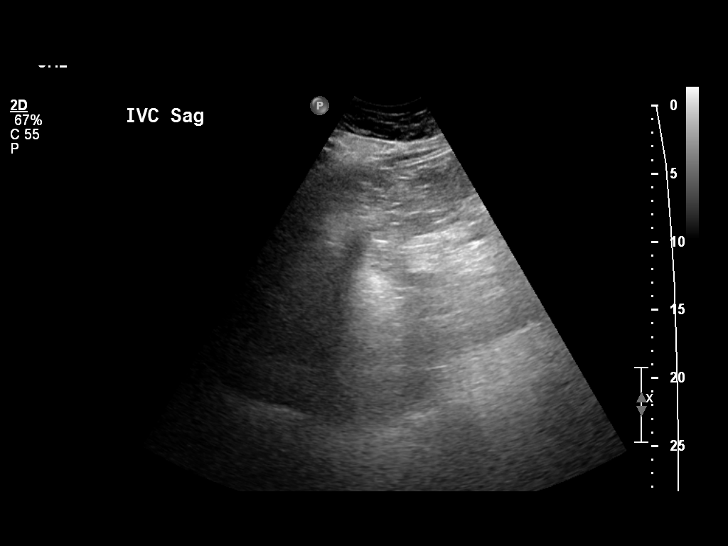
[im 10/57]
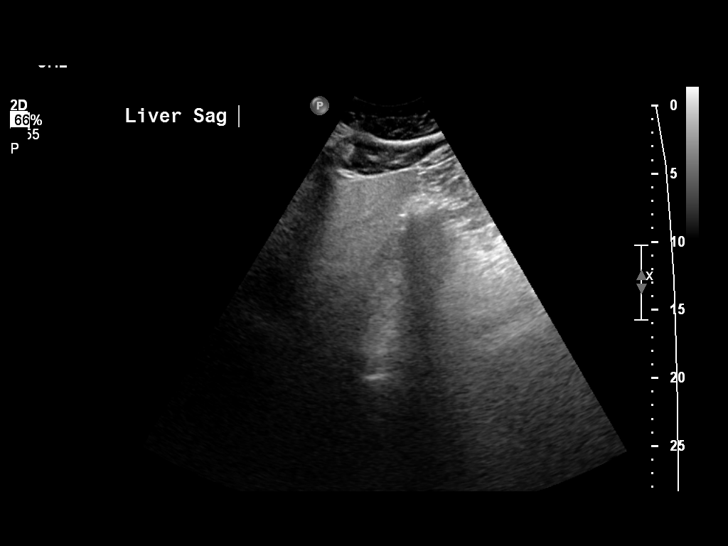
[im 15/57]
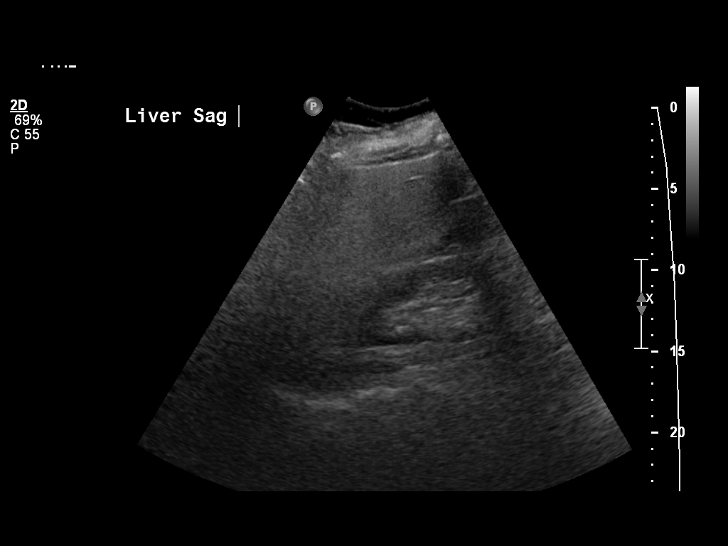
[im 19/57]
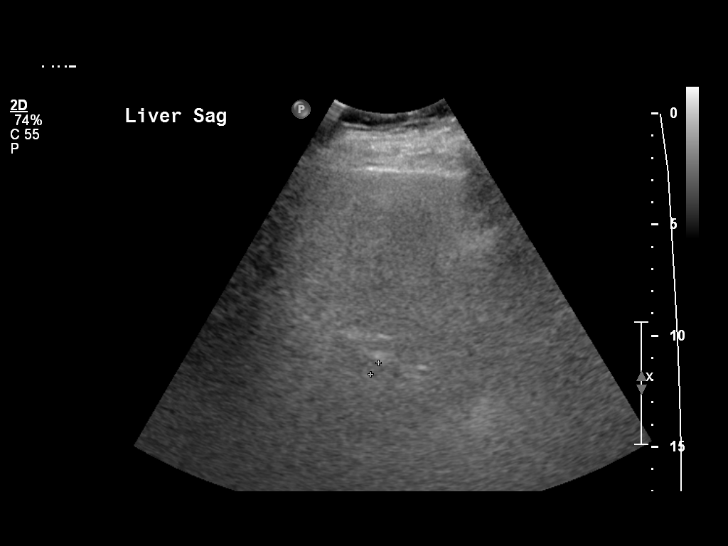
[im 22/57]
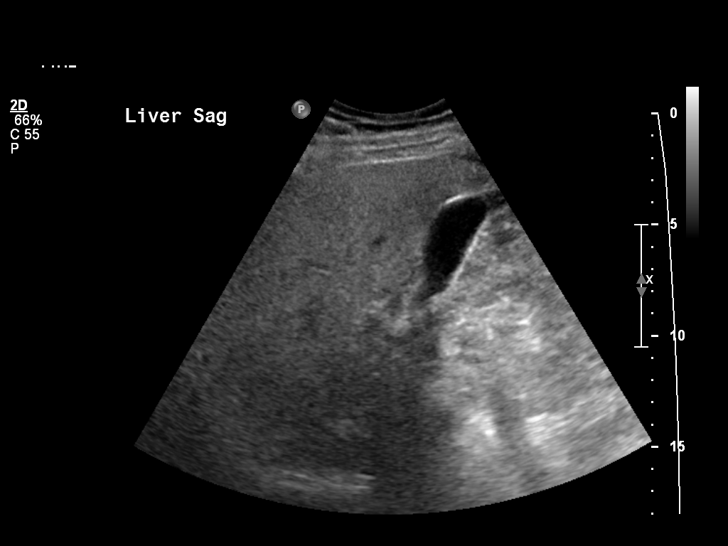
[im 26/57]
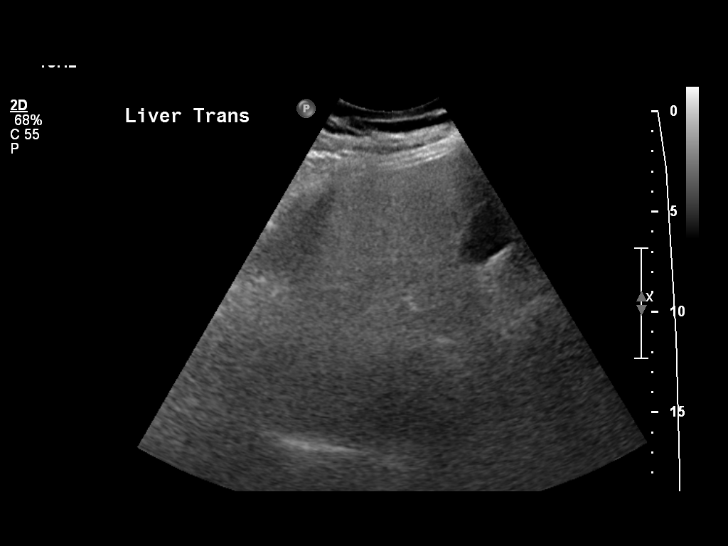
[im 31/57]
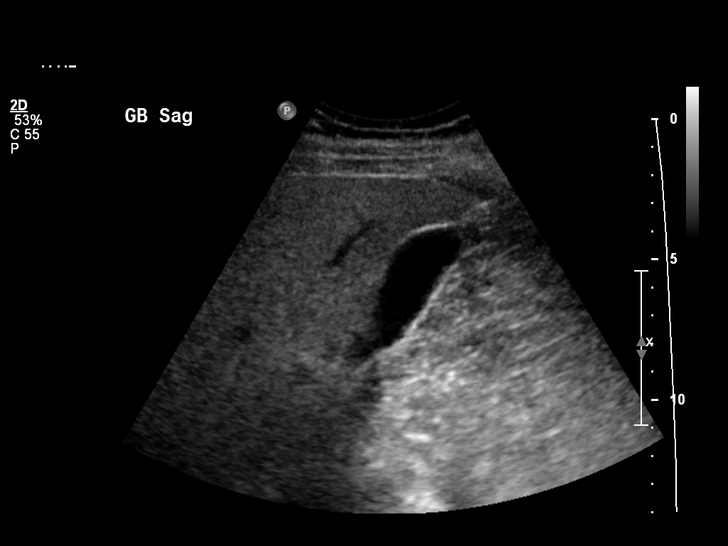
[im 36/57]
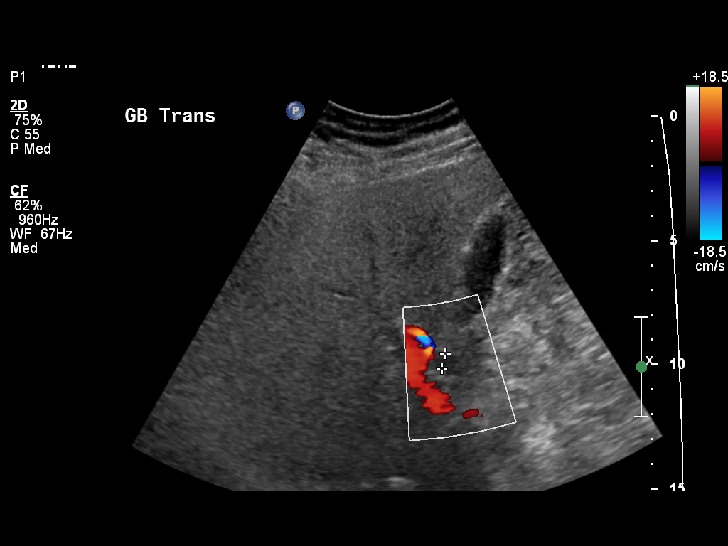
[im 38/57]
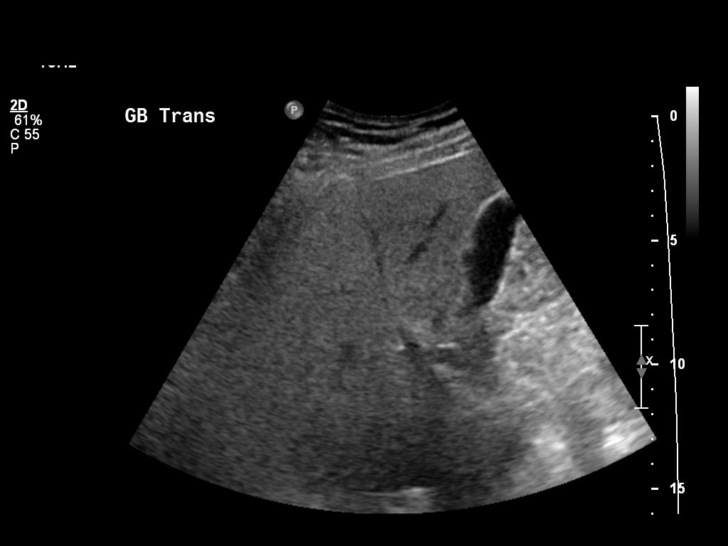
[im 43/57]
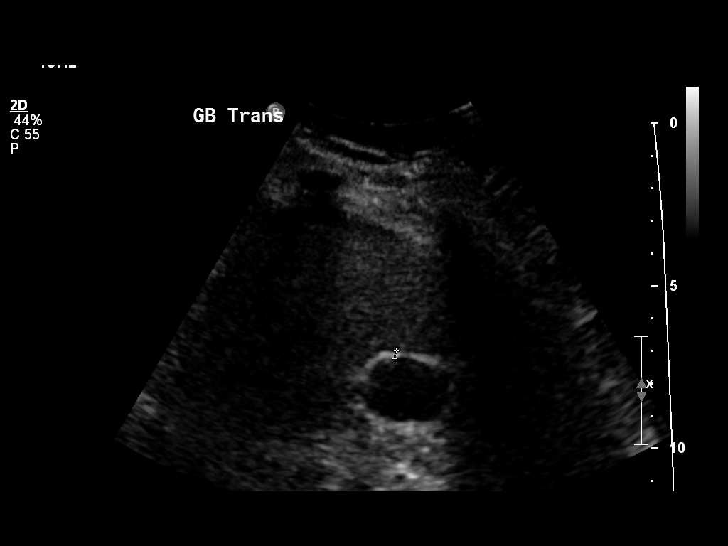
[im 47/57]
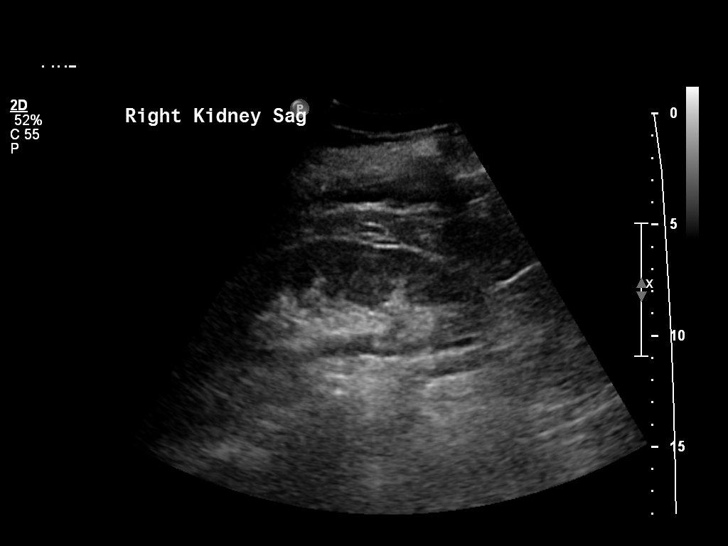
[im 52/57]
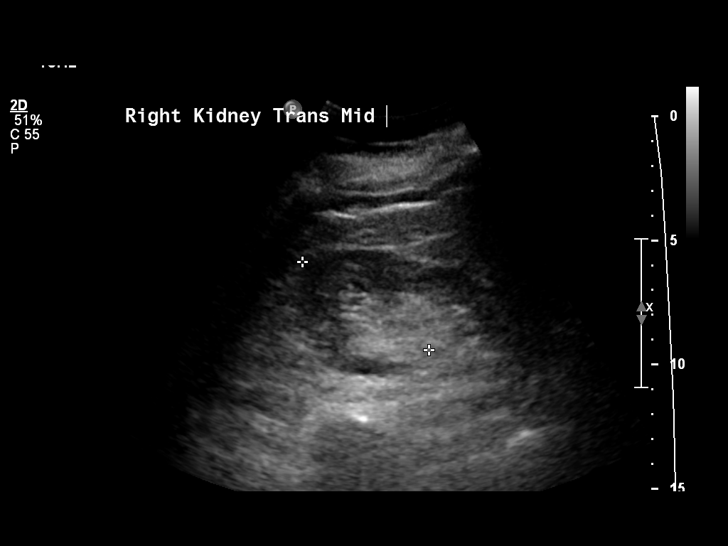
[im 57/57]
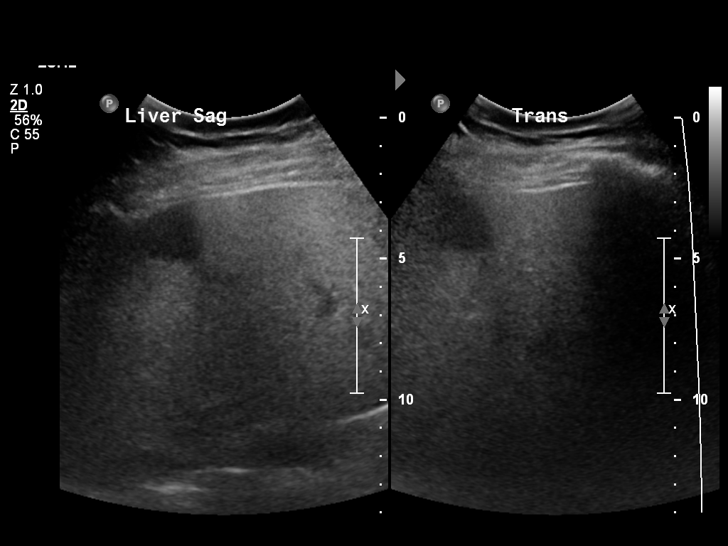

[14 of 25 positions shown; findings below may reference images not displayed]

FINDINGS: Liver is fatty and borderline enlarged measuring 17 cm in maximum sagittal dimension. Fatty infiltration limits evaluation for focal hepatic mass. A 2 cm hypoechoic area within the superior right hepatic lobe may represent a previously known hemangioma. There is no intra or extrahepatic biliary ductal dilatation. Common bile duct measures 6 mm. No sludge or shadowing gallstones are seen. There is no gallbladder wall thickening or pericholecystic fluid. Pancreas is incompletely visualized due to artifact from overlying bowel gas. Right kidney measures 13 cm and is normal.

Visualized abdominal aorta is without aneurysmal dilatation. IVC is normal. Portal vein measures 6 mm in diameter and demonstrates hepatopetal flow. Hepatic veins are also patent. There is no ascites.
IMPRESSION: 1. Fatty and borderline enlarged liver. Stable 2 cm right hepatic lobe probable hemangioma.

2. No evidence of cholelithiasis or acute cholecystitis. 

3. Pancreas incompletely visualized due to artifact from overlying bowel gas.

## 2019-10-27 IMAGING — MG 2D DX MAMMO UNI W/CAD
1 series · 3 of 3 positions shown · non-contrast
Comparison: 09/28/2019

------------- REPORT [id number] -------------
EXAM:  2D LEFT DIAGNOSTIC DIGITAL MAMMOGRAM WITH CAD
INDICATION: History of right breast cancer, status post mastectomy.

[Series 1143: L CC · left · 3 of 3 slices shown]
[im 1/3]
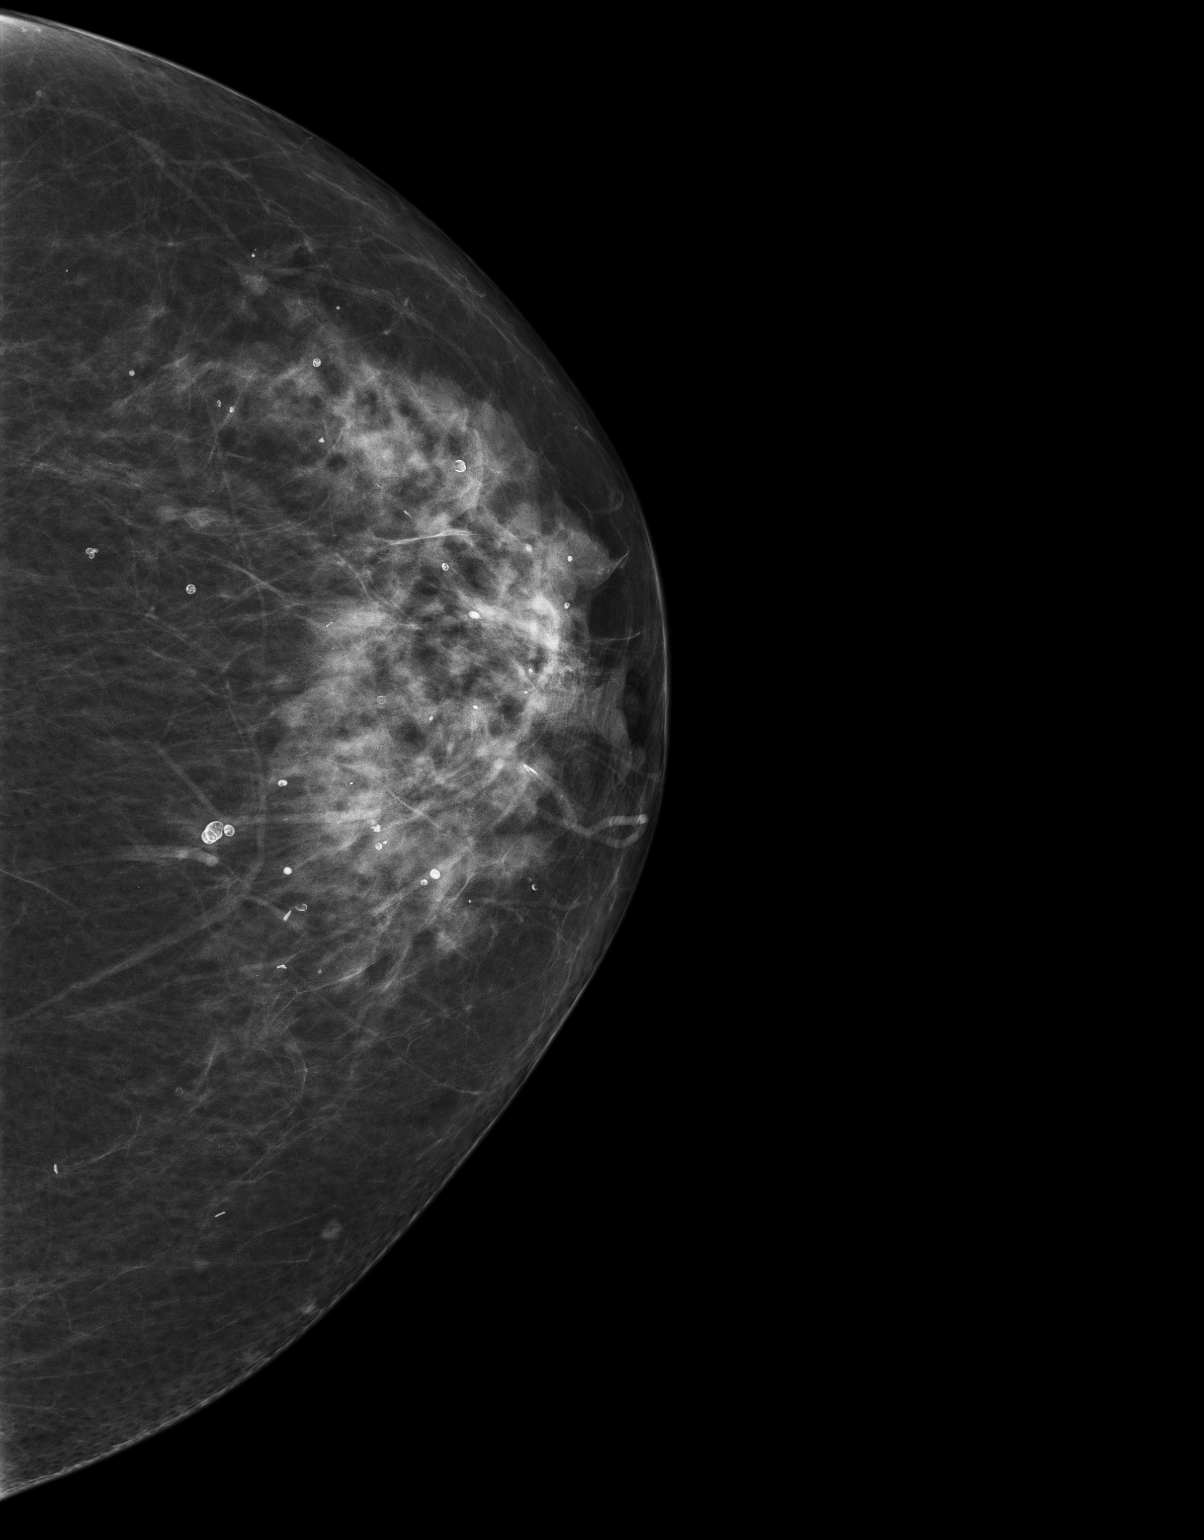
[im 2/3]
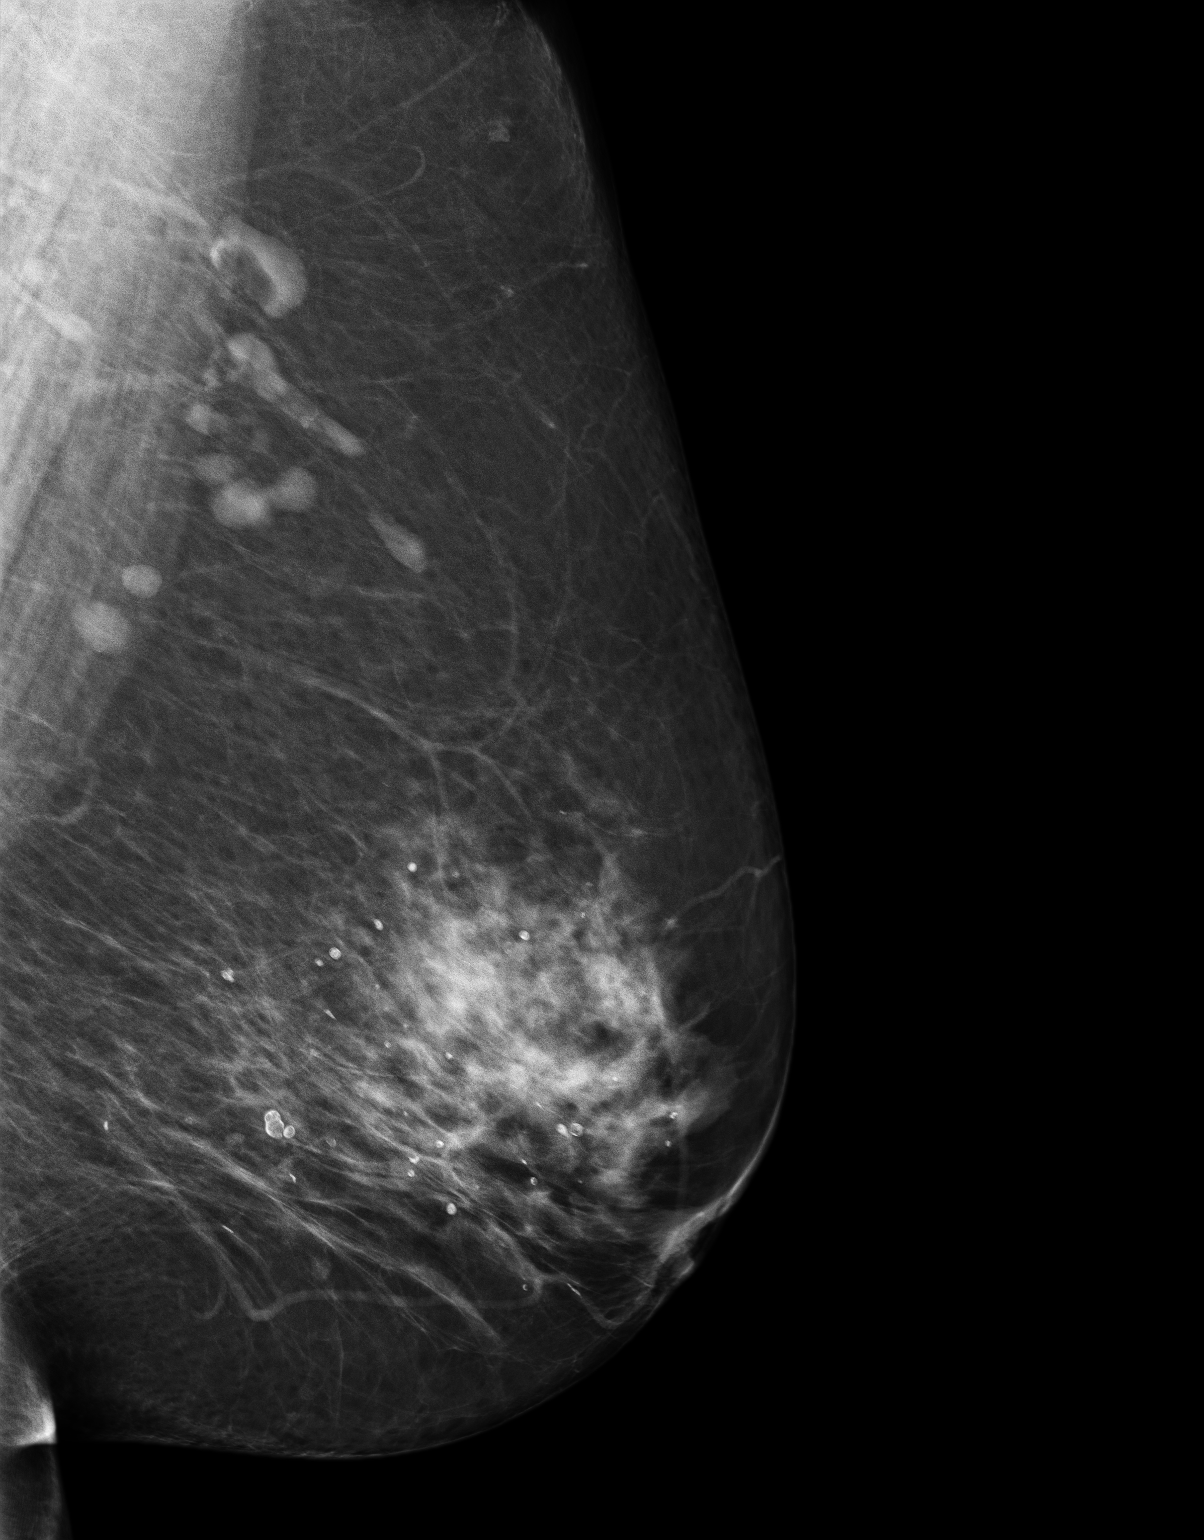
[im 3/3]
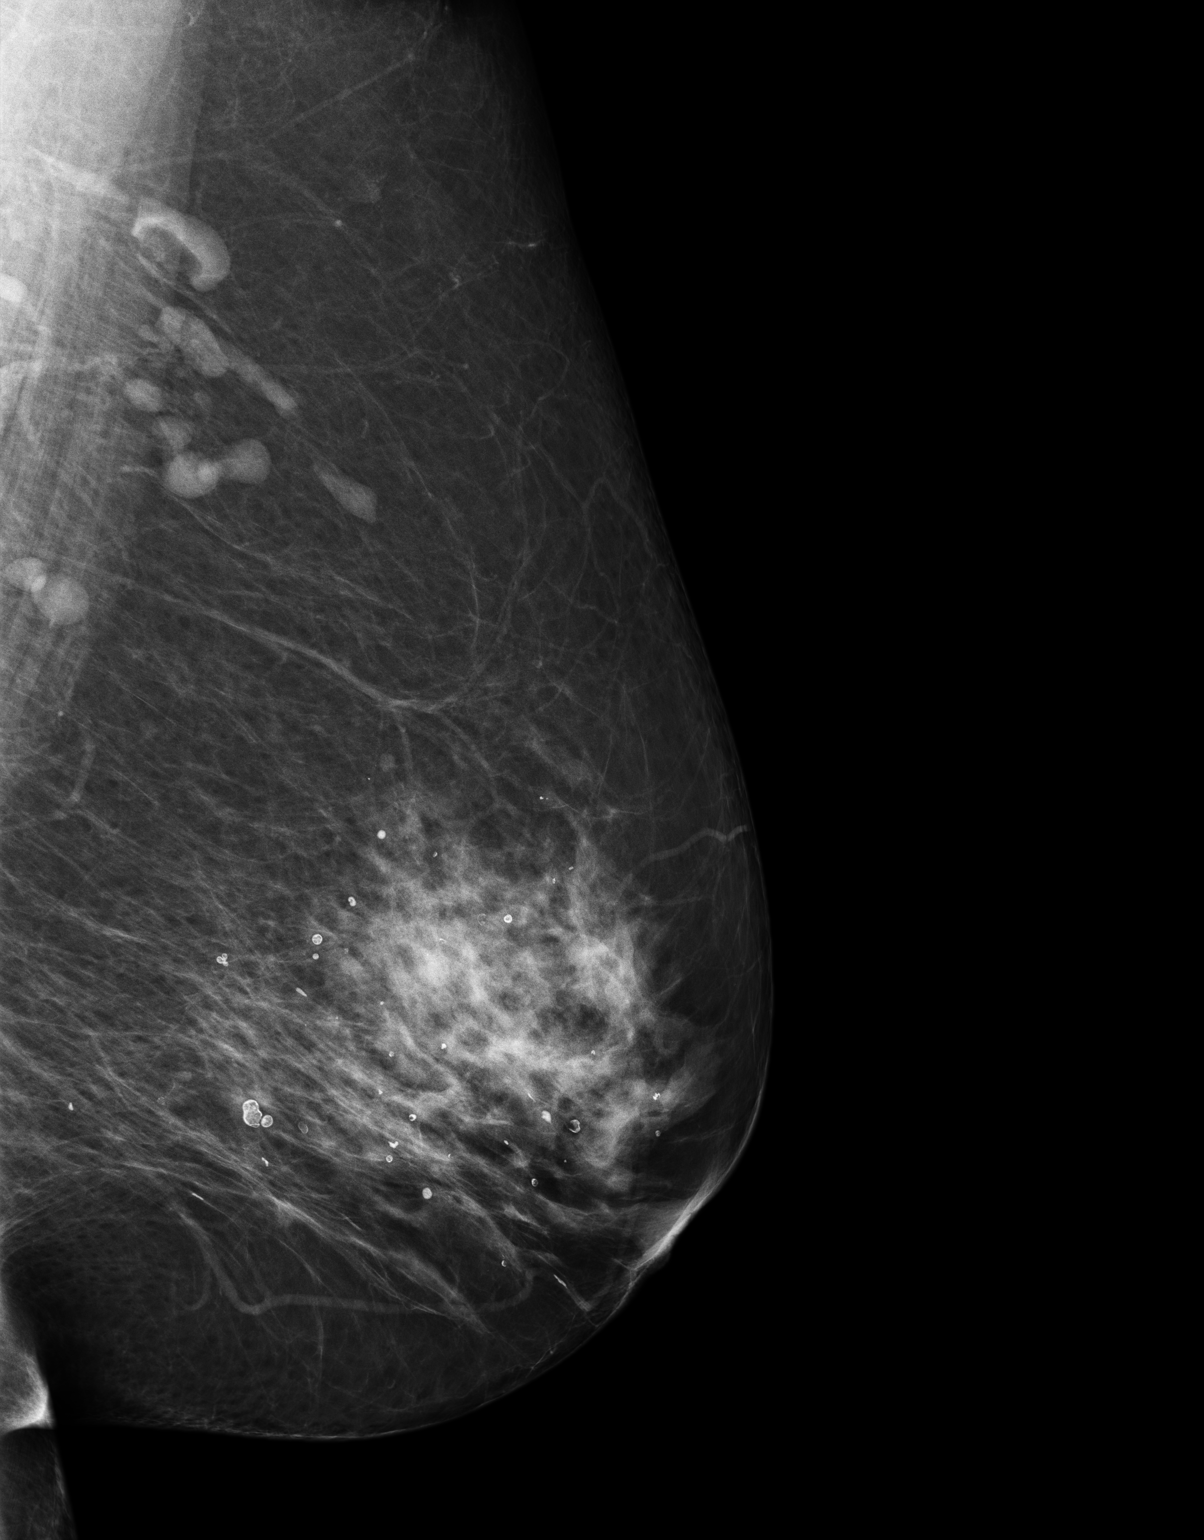

[3 of 3 positions shown; findings below may reference images not displayed]

FINDINGS: A unilateral left mammogram is performed showing heterogeneously dense glandular tissue in the central portions of the breast.  There are numerous coarse benign calcifications scattered throughout the glandular tissue.  When compared with an earlier study dated 09/28/2019, no interval development of a focal mass or malignant calcifications is seen.
IMPRESSION: 1.  Normal stable unilateral left mammogram. 

2.  ACR BREAST DENSITY:  C (Heterogeneously dense).

3.  BIRADS 1-Negative. Patient has been added in a reminder system with a target date for the next screening mammography.

4.  Management recommendation:  Routine mammographic screening.  

Final Assessment Code:

Bi-Rads 1

BI-RADS 0
Need additional imaging evaluation.

BI-RADS 1
Negative mammogram.

BI-RADS 2
Benign finding.

BI-RADS 3
Probably benign finding; short-interval follow-up suggested.

BI-RADS 4
Suspicious abnormality; biopsy should be considered.

BI-RADS 5
Highly suggestive of malignancy; appropriate action should be taken.

BI-RADS 6
Known biopsy-proven malignancy; appropriate action should be taken. 

NOTE:
In compliance with Federal regulations, the results of this mammogram are being sent to the patient.

------------- REPORT [id number] -------------
[facility]
[address]
Daina Ms.BICK, [place]:
We wish to report the following on your recent mammography examination. We are sending a report to your referring physician or other health care provider. 
(       Normal/Negative:
No evidence of cancer.
This statement is mandated by the [place], Department of Health.
Your examination was performed by one of our technologists, who are registered radiological technologists and also specially certified in mammography:
___
Parlak, Edaly (M)
___
Nepomuceno, Martinez (M)

Your mammogram was interpreted by our radiologist.

( 
Sofeine Made, M.D.

(Annual Breast Examination by a physician or other health care provider
(Annual Mammography Screening beginning at age 40
(Monthly Breast Self Examination

## 2021-04-10 IMAGING — MR MRI LUMBAR SPINE WITHOUT AND WITH CONTRAST
8 series · 48 of 48 positions shown · IV contrast (gadavist)
Comparison: CT abdomen pelvis dated 10/06/2018.

﻿EXAM:  [id number]   MRI LUMBAR SPINE WITHOUT AND WITH CONTRAST
INDICATION: Ataxia and tremors.
TECHNIQUE: Multiplanar multisequential MRI of the lumbar spine was performed without and with 5 mL of Gadavist. A non conventional body coil was utilized due to patient's extremely large body habitus.

[Series 4: T2 · sagittal · 5.0mm · 1.00mm/px · 5 of 13 slices shown (1 of 2)]
[im 1/13]
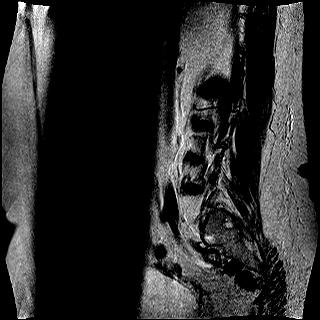
[im 4/13]
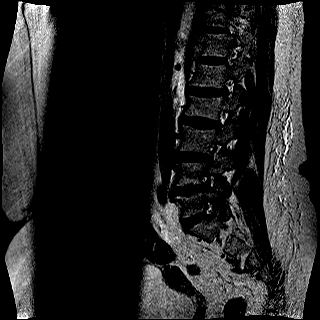
[im 7/13]
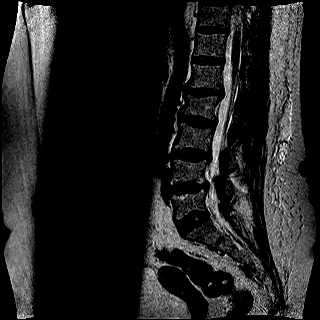
[im 10/13]
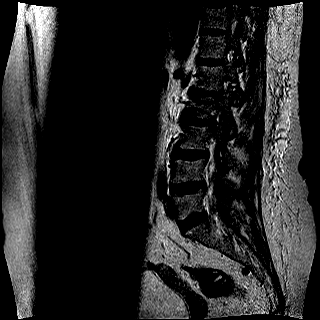
[im 13/13]
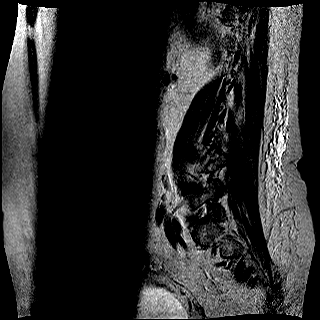

[Series 5: T1 · sagittal · 5.0mm · 1.00mm/px · 4 of 13 slices shown]
[im 1/13]
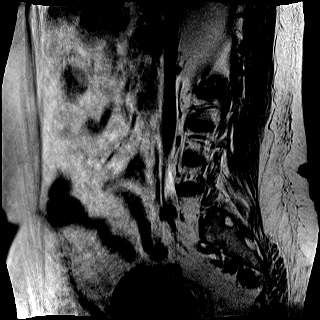
[im 5/13]
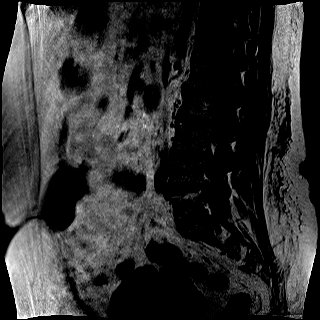
[im 9/13]
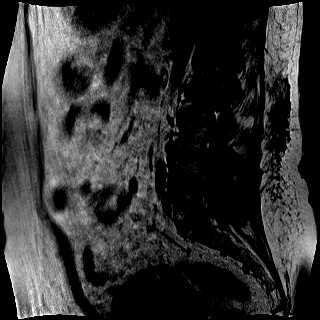
[im 13/13]
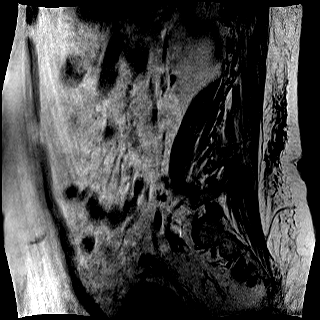

[Series 6: T1 fat-sat · sagittal · non-contrast · 5.0mm · 1.25mm/px · 4 of 13 slices shown (1 of 2)]
[im 1/13]
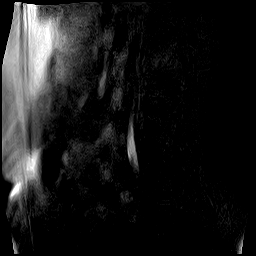
[im 5/13]
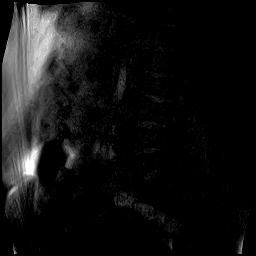
[im 9/13]
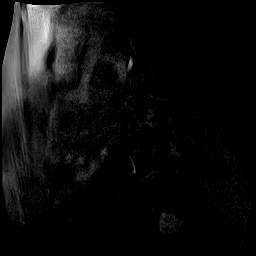
[im 13/13]
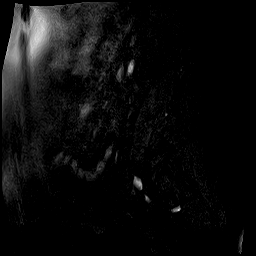

[Series 7: STIR · sagittal · 5.0mm · 1.25mm/px · 4 of 13 slices shown]
[im 1/13]
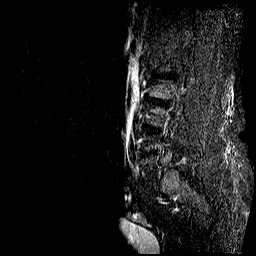
[im 5/13]
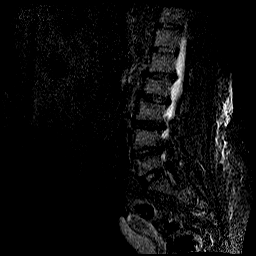
[im 9/13]
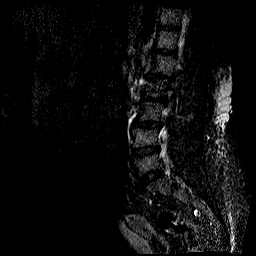
[im 13/13]
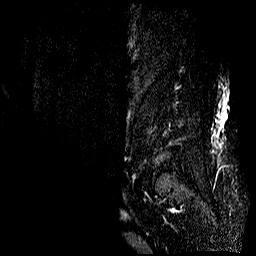

[Series 8: T2 · axial · 5.0mm · 0.89mm/px · z∈[-103,+89]mm · 9 of 25 slices shown (2 of 2)]
[im 1/25]
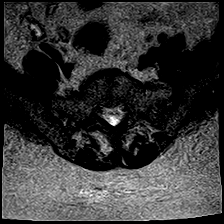
[im 4/25]
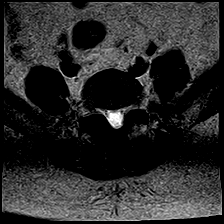
[im 7/25]
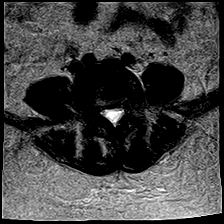
[im 10/25]
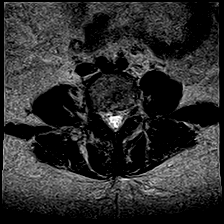
[im 13/25]
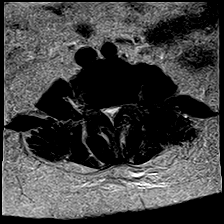
[im 16/25]
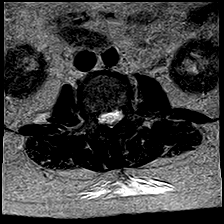
[im 19/25]
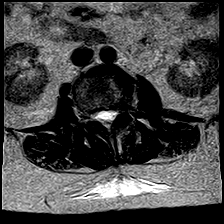
[im 22/25]
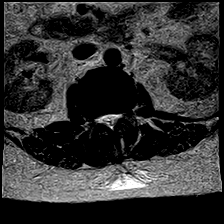
[im 25/25]
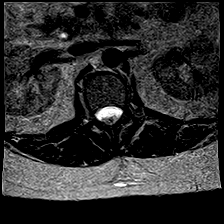

[Series 9: T1 fat-sat · axial · non-contrast · 5.0mm · 0.89mm/px · z∈[-103,+89]mm · 9 of 25 slices shown (2 of 2)]
[im 1/25]
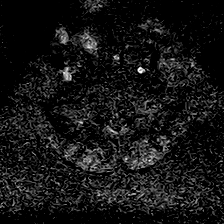
[im 4/25]
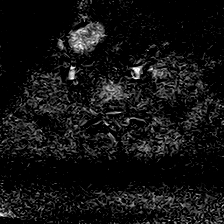
[im 7/25]
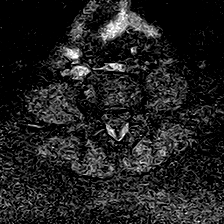
[im 10/25]
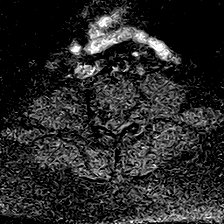
[im 13/25]
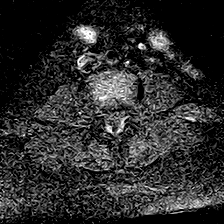
[im 16/25]
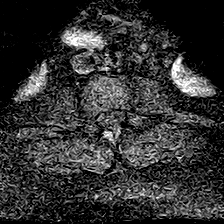
[im 19/25]
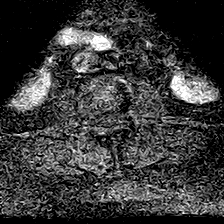
[im 22/25]
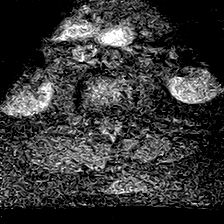
[im 25/25]
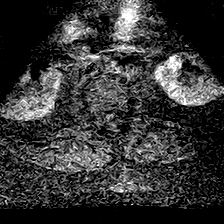

[Series 10: T1 fat-sat post-contrast · axial · 5.0mm · 0.89mm/px · z∈[-103,+89]mm · 9 of 25 slices shown (1 of 2)]
[im 1/25]
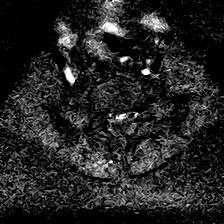
[im 4/25]
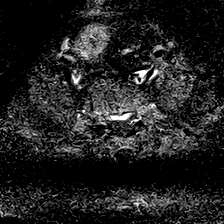
[im 7/25]
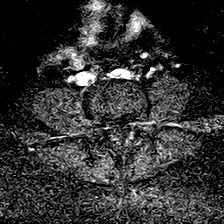
[im 10/25]
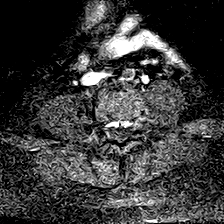
[im 13/25]
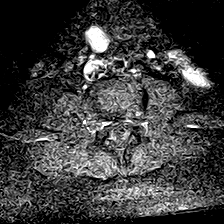
[im 16/25]
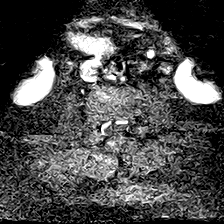
[im 19/25]
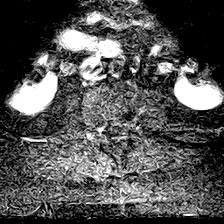
[im 22/25]
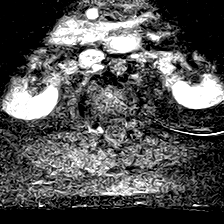
[im 25/25]
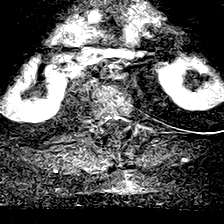

[Series 11: T1 fat-sat post-contrast · sagittal · 5.0mm · 1.25mm/px · 4 of 13 slices shown (2 of 2)]
[im 1/13]
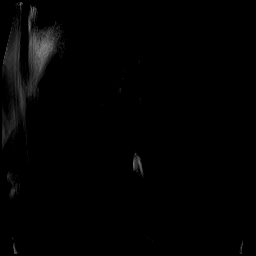
[im 5/13]
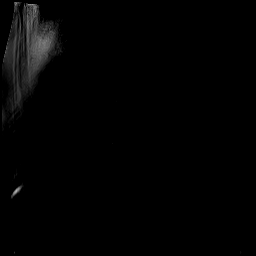
[im 9/13]
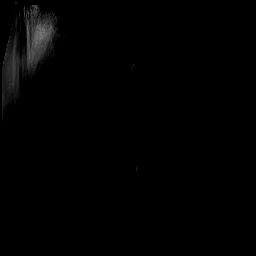
[im 13/13]
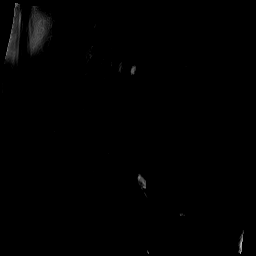

[48 of 48 positions shown; findings below may reference images not displayed]

FINDINGS: Examination is somewhat degraded due to excessive patient motion. There is a mild chronic L1 superior endplate compression fracture. Bone marrow signal intensity is normal. No acute fracture or subluxation is seen. Distal spinal cord is normal in signal intensity and terminates normally at T12-L1 disc space level. Spinal canal is congenitally narrow.

At L1-2 level, there is a small broad-based central disc bulge, mildly effacing the ventral thecal sac. There is mild-to-moderate bilateral neural foraminal stenosis from bulging annulus.

At L2-3 level, there is a small broad-based central disc bulge, mildly effacing the ventral thecal sac. There is moderate left and mild right neural foraminal stenosis from facet arthropathy and bulging annulus.

At L3-4 level, there is a small broad-based central disc bulge, mildly effacing the ventral thecal sac. There is mild-to-moderate right and mild left neural foraminal stenosis from facet arthropathy and bulging annulus.

At L5-S1 level, there is moderate bilateral neural foraminal stenosis from facet arthropathy and bulging annulus.

L5-S1 level and paraspinal soft tissues are unremarkable. Following intravenous contrast administration, there is no abnormal nerve root or paraspinal soft tissue enhancement.
IMPRESSION: 1. Mild chronic L1 superior endplate compression fracture. 

2. No significant disc herniation or spinal stenosis at any level. 

3. Multilevel neural foraminal stenosis as detailed above.

## 2021-04-19 IMAGING — MR MRI THORACIC SPINE WITHOUT AND WITH CONTRAST
4 of 10 series · 19 of 48 positions shown · IV contrast (gadavist)
Comparison: None available.

﻿EXAM:  [id number]   MRI THORACIC SPINE WITHOUT AND WITH CONTRAST
INDICATION: Tremors and gait abnormality.
TECHNIQUE: Multiplanar multisequential MRI of the thoracic spine was performed without and with 5 mL of Gadavist. A non conventional body coil was utilized due to patient's extremely large body habitus.

[Series 11: T2 · sagittal · 5.0mm · 0.78mm/px · 5 of 11 slices shown (1 of 3)]
[im 1/11]
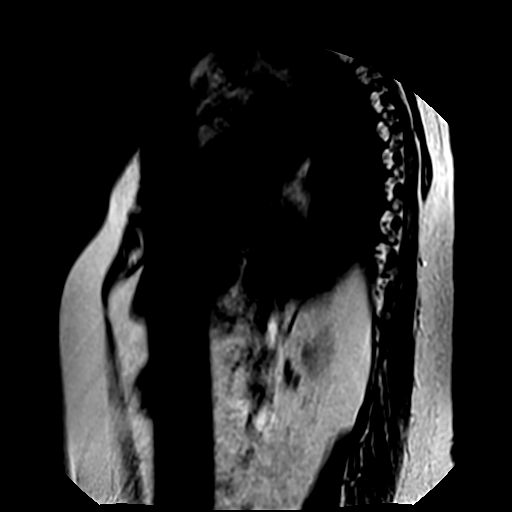
[im 3/11]
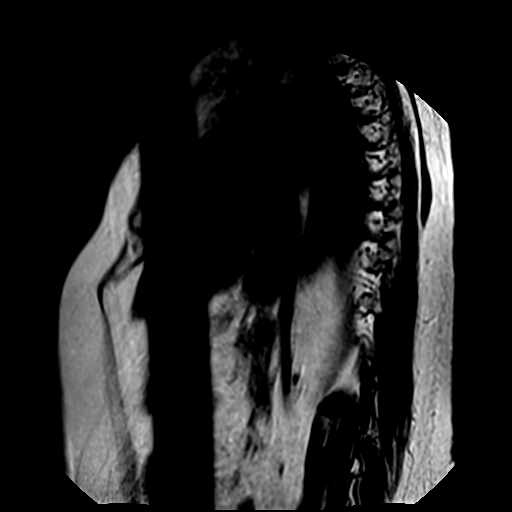
[im 6/11]
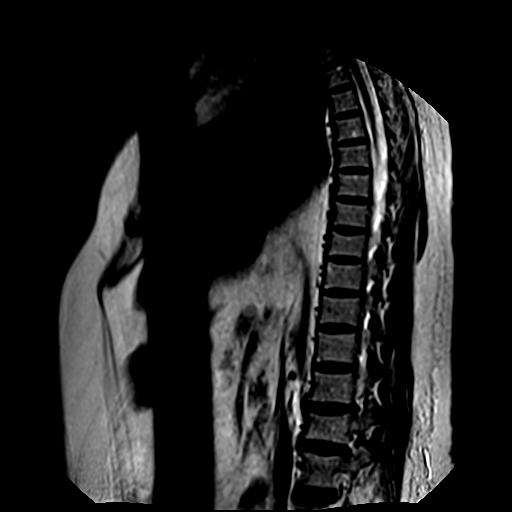
[im 8/11]
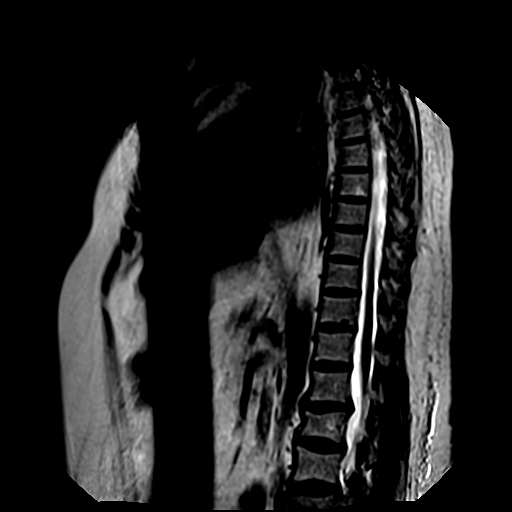
[im 11/11]
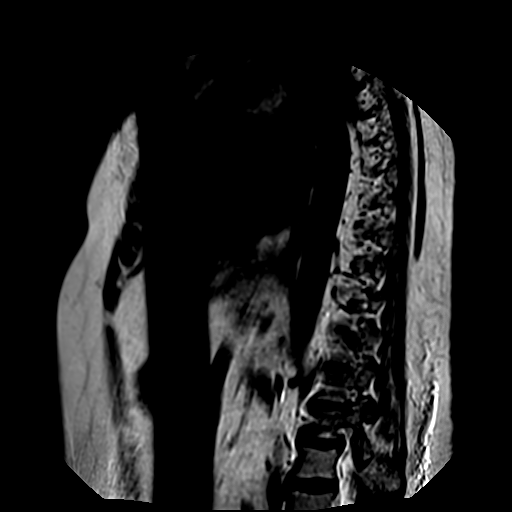

[Series 16: T1 · sagittal · 5.0mm · 0.78mm/px · 4 of 11 slices shown]
[im 1/11]
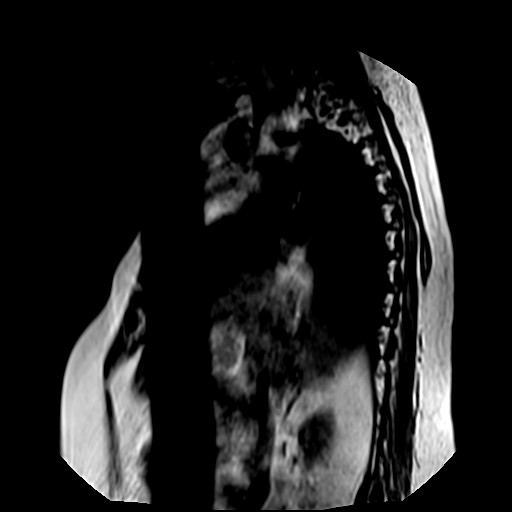
[im 3/11]
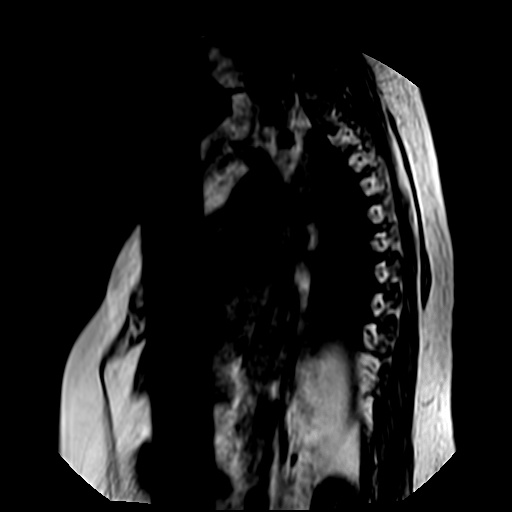
[im 6/11]
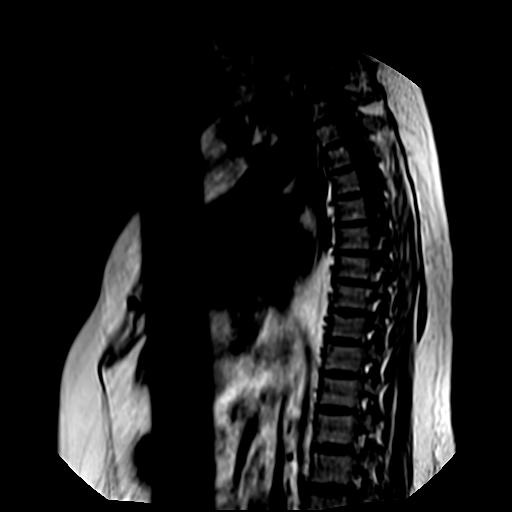
[im 11/11]
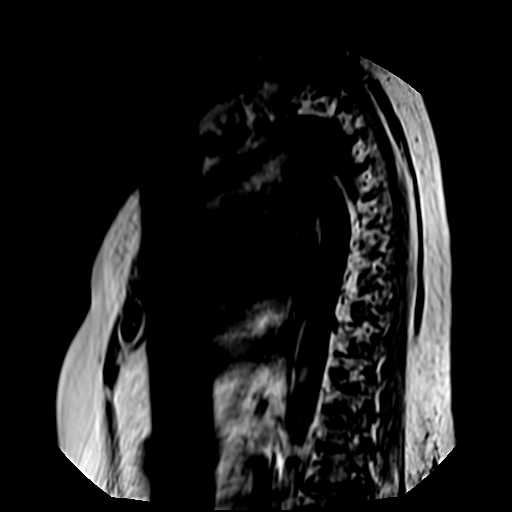

[Series 20: T2 · axial · 5.0mm · 0.45mm/px · z∈[-134,+107]mm · 5 of 12 slices shown (2 of 3)]
[im 1/12]
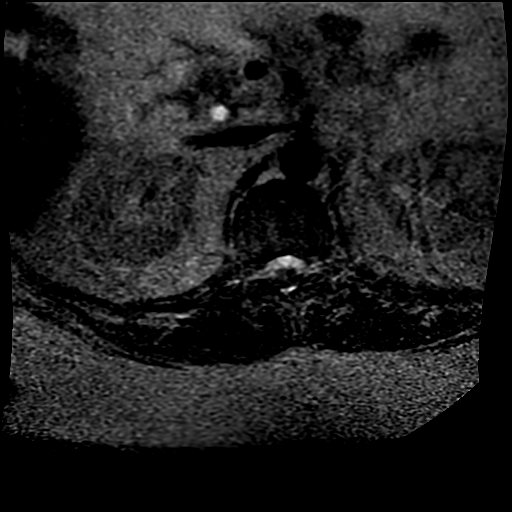
[im 3/12]
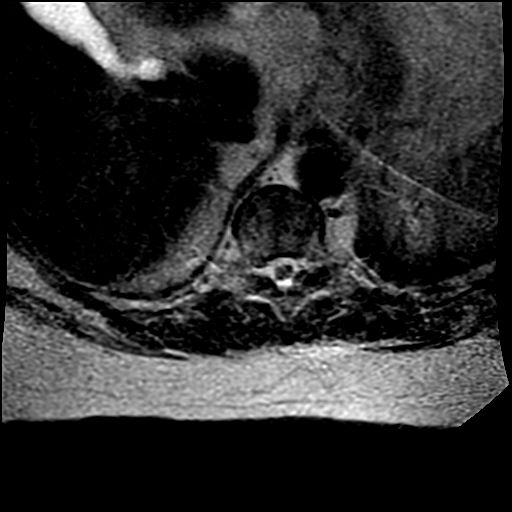
[im 6/12]
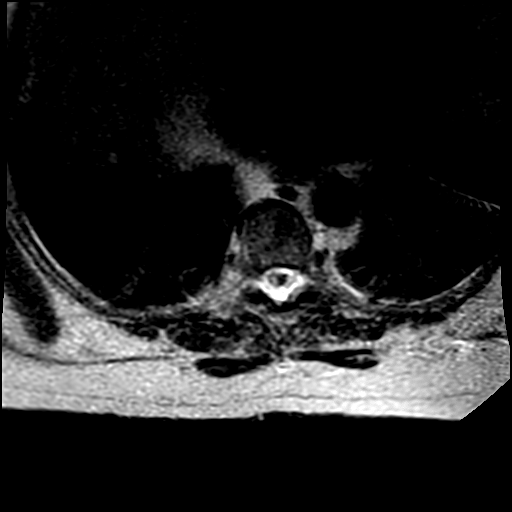
[im 9/12]
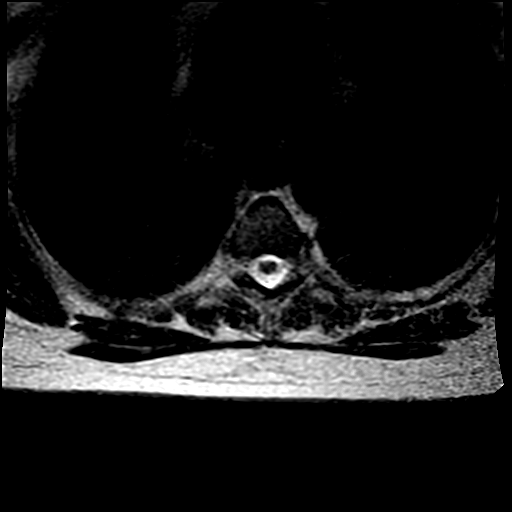
[im 12/12]
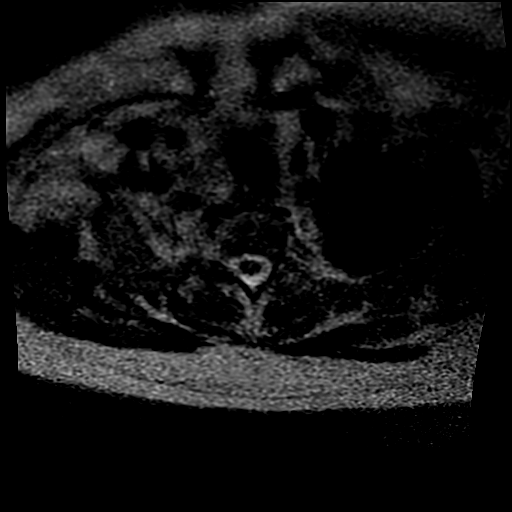

[Series 22: T2 · sagittal · 5.0mm · 0.78mm/px · 5 of 11 slices shown (3 of 3)]
[im 1/11]
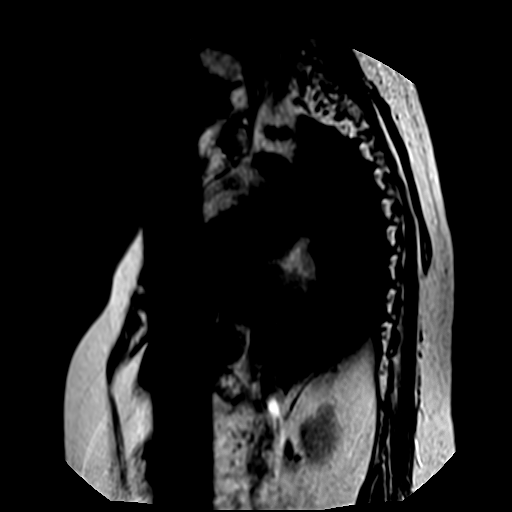
[im 3/11]
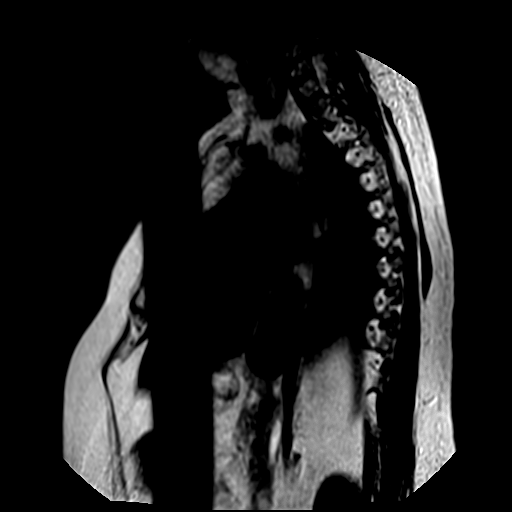
[im 6/11]
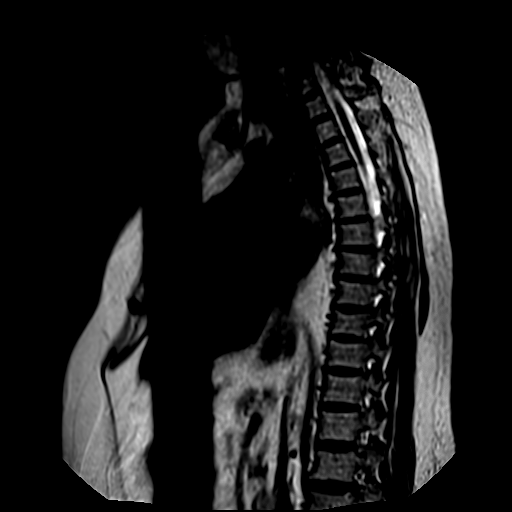
[im 8/11]
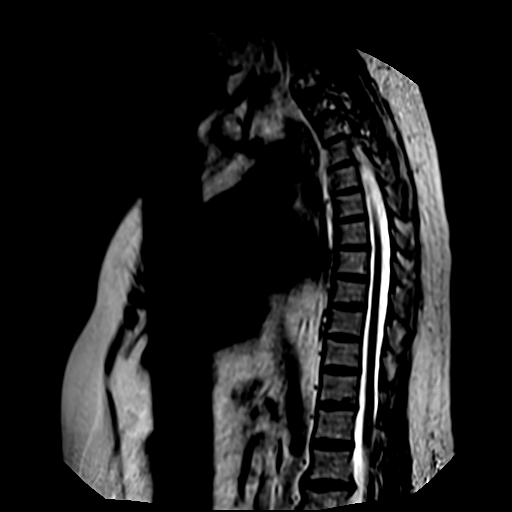
[im 11/11]
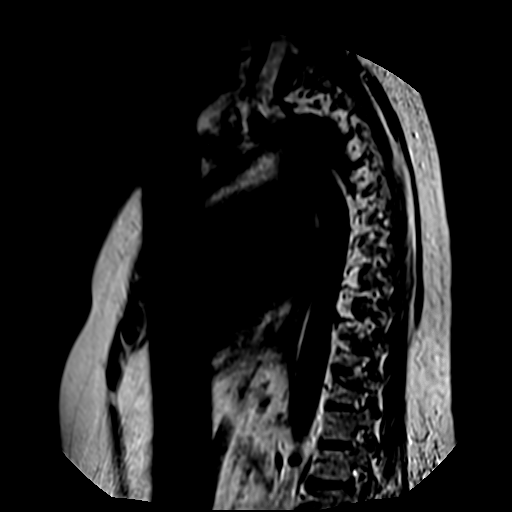

[19 of 48 positions shown; findings below may reference images not displayed]

FINDINGS: Examination is somewhat limited due to patient's large body habitus. Vertebral bodies are normal in height, alignment and signal intensity. There is no acute fracture or subluxation. Visualized spinal cord is normal in signal intensity without evidence of compression at any level.

There is no significant disc herniation, spinal canal or neural foraminal stenosis at any level.

Paraspinal soft tissues are also unremarkable. Following intravenous contrast administration, there is no abnormal spinal cord or paraspinal soft tissue enhancement. There is no pleural effusion.
IMPRESSION: Unremarkable limited exam.

## 2021-12-06 ENCOUNTER — Ambulatory Visit (INDEPENDENT_AMBULATORY_CARE_PROVIDER_SITE_OTHER): Payer: Self-pay | Admitting: Hematology & Oncology

## 2022-01-15 ENCOUNTER — Encounter (INDEPENDENT_AMBULATORY_CARE_PROVIDER_SITE_OTHER): Payer: Self-pay | Admitting: Hematology & Oncology

## 2022-01-15 ENCOUNTER — Other Ambulatory Visit: Payer: Self-pay

## 2022-01-15 ENCOUNTER — Ambulatory Visit: Payer: Medicare Other | Attending: Hematology & Oncology | Admitting: Hematology & Oncology

## 2022-01-15 VITALS — BP 148/85 | HR 82 | Temp 98.0°F | Ht 66.0 in | Wt 248.1 lb

## 2022-01-15 DIAGNOSIS — Z9221 Personal history of antineoplastic chemotherapy: Secondary | ICD-10-CM | POA: Insufficient documentation

## 2022-01-15 DIAGNOSIS — C50919 Malignant neoplasm of unspecified site of unspecified female breast: Secondary | ICD-10-CM

## 2022-01-15 DIAGNOSIS — Z86718 Personal history of other venous thrombosis and embolism: Secondary | ICD-10-CM | POA: Insufficient documentation

## 2022-01-15 DIAGNOSIS — D6859 Other primary thrombophilia: Secondary | ICD-10-CM | POA: Insufficient documentation

## 2022-01-15 DIAGNOSIS — Z853 Personal history of malignant neoplasm of breast: Secondary | ICD-10-CM | POA: Insufficient documentation

## 2022-01-15 DIAGNOSIS — Z7901 Long term (current) use of anticoagulants: Secondary | ICD-10-CM | POA: Insufficient documentation

## 2022-01-15 DIAGNOSIS — Z9011 Acquired absence of right breast and nipple: Secondary | ICD-10-CM | POA: Insufficient documentation

## 2022-01-15 DIAGNOSIS — Z08 Encounter for follow-up examination after completed treatment for malignant neoplasm: Secondary | ICD-10-CM | POA: Insufficient documentation

## 2022-01-15 DIAGNOSIS — Z79811 Long term (current) use of aromatase inhibitors: Secondary | ICD-10-CM | POA: Insufficient documentation

## 2022-01-15 DIAGNOSIS — M797 Fibromyalgia: Secondary | ICD-10-CM | POA: Insufficient documentation

## 2022-01-15 MED ORDER — ANASTROZOLE 1 MG TABLET
1.0000 mg | ORAL_TABLET | Freq: Every day | ORAL | 3 refills | Status: AC
Start: 2022-01-15 — End: ?

## 2022-01-15 NOTE — Progress Notes (Signed)
[place]       CANCER CENTER NOTE     Date: 01/15/2022  Name: Kelly Harrison  MRN: [id number]  Referring Physician: Farrel Gordon, NP  Primary Care Provider: Farrel Gordon, NP    REASON FOR VISIT: For evaluation and management of breast cancer  HISTORY OF PRESENT ILLNESS:   The patient is an 61 yo woman with h/o breast cancer s/p rt mastectomy, s/p chemotherapy and then Arimidex since then. She has factor V Leiden mutation and is on long term anticoagulation with warfarin. She has past h/o DVT, about 15 years afo.   She feels well , denies any breast symptoms, anorexia, weight loss.  She has chronic pain due to fibromyalgia .  Denies hot flashes, new bone pain or headaches.  H/o GERD with reflux symptoms.  C/o pain in both ears and symptoms of tinnitus.   Her last left breast mammogram was in June 2022 and is due for another one.    REVIEW OF SYSTEMS:  General: (-) pain. (-) fevers (-) chills. (-) weight loss. (-) fatigue.  Lymphatic: (-) palpable masses. (-) night sweats.  Heme: (-) easy bruising (-) bleeding.  (-) recurrent infections.   HEENT. (-) vision changes (-) hearing changes. (-) dysphagia. (-) sore throat.   Heart: (-) chest pain. (-) palpitation. (-) orthopnea. (-) LE edema.   Lungs: (-) dyspnea (on exertion) (-) hemoptysis. (-) cough.   Abdomen: (-) poor appetite. (-) abdominal pain. (-) nausea (-) vomiting. (-) diarrhea. (-) constipation.   GU: (-) dysuria (-) Urgency. (-) Hematuria.   MS. (-) joint pain (-) ext swelling. (-) Back pain.    Dermatologic: (-) rashes. (-) pruritus.   Psychiatric: (-) Depression. (-) anxiety. (-) insomnia.   Neurologic: (-) headaches. (-) neuropathy. (-) weakness. (-) memory problems.  Other review of systems negative.     PAST MEDICAL HISTORY:  Past Medical History:   Diagnosis Date   . Chronic sinusitis    . DVT (deep venous thrombosis) (CMS HCC)    . Dyslipidemia    . Embolism (CMS HCC)    . Fatty liver     . Fibromyalgia    . Generalized anxiety disorder    . GERD (gastroesophageal reflux disease)    . Hx of breast cancer    . Hypothyroidism    . Migraines    . Sleep apnea    . Unspecified glaucoma(365.9)          MEDICATIONS:  Current Outpatient Medications   Medication Sig   . anastrozole (ARIMIDEX) 1 mg Oral Tablet Take 1 Tablet (1 mg total) by mouth Once a day   . atorvastatin (LIPITOR) 20 mg Oral Tablet Take 1 Tablet (20 mg total) by mouth Once a day   . busPIRone (BUSPAR) 10 mg Oral Tablet    . divalproex (DEPAKOTE) 250 mg Oral Tablet, Delayed Release (E.C.) TAKE 1 TABLET BY MOUTH EVERY DAY IN THE MORNING AND AT BEDTIME   . DULoxetine (CYMBALTA DR) 60 mg Oral Capsule, Delayed Release(E.C.)    . famotidine (PEPCID) 20 mg Oral Tablet Take 1 Tablet (20 mg total) by mouth Once a day   . fenofibrate micronized (LOFIBRA) 134 mg Oral Capsule Take 1 Capsule (134 mg total) by mouth Once a day   . ferrous sulfate (FEOSOL) 325 mg (65 mg iron) Oral Tablet Take 1 Tablet (325 mg total) by mouth   . furosemide (LASIX)  20 mg Oral Tablet Take 1 Tablet (20 mg total) by mouth Once a day   . lactulose (ENULOSE) 10 gram/15 mL Oral Solution Take 45 mL by mouth   . levothyroxine (SYNTHROID) 50 mcg Oral Tablet Take 1 Tablet (50 mcg total) by mouth   . montelukast (SINGULAIR) 10 mg Oral Tablet Take 1 Tablet (10 mg total) by mouth Once a day   . omega-3 fatty acid (LOVAZA) 1 gram Oral Capsule TAKE 2 CAPSULES EVERY DAY BY MOUTH .   Marland Kitchen pantoprazole (PROTONIX) 40 mg Oral Tablet, Delayed Release (E.C.) Take 1 Tablet (40 mg total) by mouth   . pregabalin (LYRICA) 150 mg Oral Capsule TAKE 1 CAPSULE BY MOUTH EVERY MORNING, THEN 1 CAPSULE AT NOON, THEN 1 CAPSULE BEFORE BEDTIME   . rOPINIRole (REQUIP) 1 mg Oral Tablet Take 1 Tablet (1 mg total) by mouth   . topiramate (TOPAMAX) 25 mg Oral Capsule, Sprinkle    . warfarin (COUMADIN) 5 mg Oral Tablet Take 1 Tablet (5 mg total) by mouth Once a day       ALLERGIES:  Allergies   Allergen Reactions    . Oxycodone Nausea/ Vomiting   . Azithromycin Diarrhea     PAST SURGICAL HISTORY:  Past Surgical History:   Procedure Laterality Date   . CESAREAN SECTION     . HX RADICAL MASTECTOMY Right          SOCIAL HISTORY:  Social History     Socioeconomic History   . Marital status: Married     Spouse name: Not on file   . Number of children: Not on file   . Years of education: Not on file   . Highest education level: Not on file   Occupational History   . Not on file   Tobacco Use   . Smoking status: Never   . Smokeless tobacco: Never   Vaping Use   . Vaping Use: Never used   Substance and Sexual Activity   . Alcohol use: Never   . Drug use: Yes     Types: Marijuana     Comment: Medical marijuana card   . Sexual activity: Not on file   Other Topics Concern   . Not on file   Social History Narrative   . Not on file     Social Determinants of Health     Financial Resource Strain: Not on file   Transportation Needs: Not on file   Social Connections: Not on file   Intimate Partner Violence: Not on file   Housing Stability: Not on file     FAMILY HISTORY:  Family Medical History:     Problem Relation (Age of Onset)    Asthma Mother    Congestive Heart Failure Mother    Diabetes Paternal Aunt    Hypertension (High Blood Pressure) Mother, Brother    Parkinsons Disease Brother            PHYSICAL EXAMINATION:  Vitals: Most Recent Vitals    BP (!) 148/85 (Site: Left, Patient Position: Sitting, Cuff Size: Adult)   Pulse 82   Temp 36.7 C (98 F) (Thermal Scan)   Ht 1.676 m ('5\' 6"'$ )   Wt 113 kg (248 lb 1.6 oz)   LMP  (LMP Unknown)   BMI 40.04 kg/m      Appearance: Normal appearance.    HENT:   Head: Normocephalicand atraumatic.   Nose: Nose normal.   Mouth/Throat:   Mouth: Mucous membranes are moist.  Cardiovascular:   Rate and Rhythm: Normal rateand regular rhythm.   Pulses: Normal pulses.   Heart sounds: Normal heart sounds.   Pulmonary:   Effort: Pulmonary effort is normal.   Breath sounds:  Normal breath sounds.   Abdominal:   General: Abdomen is flat. Bowel sounds arenormal.   Palpations: Abdomen is soft.   Breasts:  S/p rt mastectomy  Left breast; no masses, discharge or adenopathy  Skin:  General: Skin is warmand dry.   Neurological:   General: No focal deficitpresent.   Mental Status: She is alertand oriented to person, place, and time. Mental status isat baseline.   Psychiatric:   Mood and Affect: Moodnormal.   Behavior: Behaviornormal.   Thought Content: Thought contentnormal.   Judgment: Judgmentnormal.         LABORATORY:  No results found for this or any previous visit (from the past 72 hour(s)).    IMAGING:  N.A.    ASSESSMENT:    60 yo woman with h/o right breast cancer s/p mastectomy and chemotherapy.  Currently on adjuvant Anastrozole.   Patient has no signs or symptoms of recurrence.  Continue surveillance.  Continue Anastrozole.  Continue warfarin for hypercoagulable state.  Left screening mammogram ordered.   Advised ENT referral for auditory symptoms. Patient will discuss with PCP.   RTC 3 months.     Willaim Sheng, MD

## 2022-01-15 NOTE — Nursing Note (Signed)
Patient completed psychosocial distress screening using the Enhanced NCCN Distress Thermometer (DT) Tool and self-reported an overall score of 0. Daughter present during time of visit. Patient due for left sided mammogram, provider ordered, patient to have imaging at [facility]. Patient remains on PO Arimidex, reports tolerating well with no s/e, refill sent by provider. No further questions or concerns at this time.

## 2022-01-29 ENCOUNTER — Ambulatory Visit (HOSPITAL_COMMUNITY): Payer: Self-pay

## 2022-03-02 ENCOUNTER — Telehealth (INDEPENDENT_AMBULATORY_CARE_PROVIDER_SITE_OTHER): Payer: Self-pay | Admitting: MEDICAL ONCOLOGY

## 2022-03-02 NOTE — Telephone Encounter (Signed)
Left voicemail for pt regarding rescheduling her pt to 05-22-22 @ 10 am.

## 2022-04-17 ENCOUNTER — Ambulatory Visit (INDEPENDENT_AMBULATORY_CARE_PROVIDER_SITE_OTHER): Payer: Self-pay | Admitting: MEDICAL ONCOLOGY

## 2022-05-04 ENCOUNTER — Other Ambulatory Visit: Payer: Self-pay

## 2022-05-04 ENCOUNTER — Other Ambulatory Visit: Payer: Medicare Other

## 2022-05-04 ENCOUNTER — Other Ambulatory Visit (HOSPITAL_COMMUNITY): Payer: Medicare Other

## 2022-05-04 DIAGNOSIS — Z79899 Other long term (current) drug therapy: Secondary | ICD-10-CM

## 2022-05-04 DIAGNOSIS — I4891 Unspecified atrial fibrillation: Secondary | ICD-10-CM

## 2022-05-06 ENCOUNTER — Other Ambulatory Visit (HOSPITAL_BASED_OUTPATIENT_CLINIC_OR_DEPARTMENT_OTHER): Payer: Medicare Other

## 2022-05-06 ENCOUNTER — Other Ambulatory Visit: Payer: Medicare Other

## 2022-05-06 ENCOUNTER — Other Ambulatory Visit: Payer: Self-pay

## 2022-05-06 DIAGNOSIS — K277 Chronic peptic ulcer, site unspecified, without hemorrhage or perforation: Secondary | ICD-10-CM

## 2022-05-10 ENCOUNTER — Other Ambulatory Visit (INDEPENDENT_AMBULATORY_CARE_PROVIDER_SITE_OTHER): Payer: Self-pay | Admitting: MEDICAL ONCOLOGY

## 2022-05-10 DIAGNOSIS — C50919 Malignant neoplasm of unspecified site of unspecified female breast: Secondary | ICD-10-CM

## 2022-05-14 ENCOUNTER — Ambulatory Visit (HOSPITAL_COMMUNITY): Payer: Self-pay | Admitting: MEDICAL ONCOLOGY

## 2022-05-14 NOTE — Telephone Encounter (Signed)
Called patient with reminder of appointment on 10/24 and to get labs prior to the visit. Patient did not answer but a Voicemail was left.

## 2022-05-20 ENCOUNTER — Other Ambulatory Visit: Payer: Medicare Other

## 2022-05-20 ENCOUNTER — Other Ambulatory Visit: Payer: Self-pay

## 2022-05-20 ENCOUNTER — Other Ambulatory Visit (HOSPITAL_COMMUNITY): Payer: Medicare Other

## 2022-05-20 DIAGNOSIS — I10 Essential (primary) hypertension: Secondary | ICD-10-CM

## 2022-05-22 ENCOUNTER — Ambulatory Visit (INDEPENDENT_AMBULATORY_CARE_PROVIDER_SITE_OTHER): Payer: Self-pay | Admitting: MEDICAL ONCOLOGY

## 2022-05-22 NOTE — Progress Notes (Unsigned)
[place]       CANCER CENTER NOTE     Date: 05/22/2022  Name: Kelly Harrison  MRN: [id number]  Referring Physician: No ref. provider found  Primary Care Provider: Farrel Gordon, NP    DIAGNOSIS:   1. Breast cancer.  2. FVL mutation with h/o DVT on indefinitie Warfarin.    SUBJECTIVE:  Patient seen and examined.  She feels well , denies any breast symptoms, anorexia, weight loss. She has chronic pain due to fibromyalgia. Denies hot flashes, new bone pain, or headaches. H/o GERD with reflux symptoms. C/o pain in both ears and symptoms of tinnitus.     HISTORY OF PRESENT ILLNESS:   The patient is an 61 yo woman with h/o breast cancer s/p rt mastectomy, s/p chemotherapy and then Arimidex since then. She has factor V Leiden mutation and is on long term anticoagulation with warfarin. She has past h/o DVT, about 15 years ago.     REVIEW OF SYSTEMS:  General: (-) pain. (-) fevers (-) chills. (-) weight loss. (-) fatigue.  Lymphatic: (-) palpable masses. (-) night sweats.  Heme: (-) easy bruising (-) bleeding.  (-) recurrent infections.   HEENT. (-) vision changes (-) hearing changes. (-) dysphagia. (-) sore throat.   Heart: (-) chest pain. (-) palpitation. (-) orthopnea. (-) LE edema.   Lungs: (-) dyspnea (on exertion) (-) hemoptysis. (-) cough.   Abdomen: (-) poor appetite. (-) abdominal pain. (-) nausea (-) vomiting. (-) diarrhea. (-) constipation.   GU: (-) dysuria (-) Urgency. (-) Hematuria.   MS. (-) joint pain (-) ext swelling. (-) Back pain.    Dermatologic: (-) rashes. (-) pruritus.   Psychiatric: (-) Depression. (-) anxiety. (-) insomnia.   Neurologic: (-) headaches. (-) neuropathy. (-) weakness. (-) memory problems.  Other review of systems negative.     PAST MEDICAL HISTORY:  Past Medical History:   Diagnosis Date    Chronic sinusitis     DVT (deep venous thrombosis) (CMS HCC)     Dyslipidemia     Embolism (CMS HCC)     Fatty liver     Fibromyalgia      Generalized anxiety disorder     GERD (gastroesophageal reflux disease)     Hx of breast cancer     Hypothyroidism     Migraines     Sleep apnea     Unspecified glaucoma(365.9)      MEDICATIONS:  Current Outpatient Medications   Medication Sig    anastrozole (ARIMIDEX) 1 mg Oral Tablet Take 1 Tablet (1 mg total) by mouth Once a day    anastrozole (ARIMIDEX) 1 mg Oral Tablet Take 1 Tablet (1 mg total) by mouth Once a day    atorvastatin (LIPITOR) 20 mg Oral Tablet Take 1 Tablet (20 mg total) by mouth Once a day    busPIRone (BUSPAR) 10 mg Oral Tablet     divalproex (DEPAKOTE) 250 mg Oral Tablet, Delayed Release (E.C.) TAKE 1 TABLET BY MOUTH EVERY DAY IN THE MORNING AND AT BEDTIME    DULoxetine (CYMBALTA DR) 60 mg Oral Capsule, Delayed Release(E.C.)     famotidine (PEPCID) 20 mg Oral Tablet Take 1 Tablet (20 mg total) by mouth Once a day    fenofibrate micronized (LOFIBRA) 134 mg Oral Capsule Take 1 Capsule (134 mg total) by mouth Once a day    ferrous sulfate (FEOSOL) 325 mg (65 mg iron) Oral Tablet Take  1 Tablet (325 mg total) by mouth    furosemide (LASIX) 20 mg Oral Tablet Take 1 Tablet (20 mg total) by mouth Once a day    levothyroxine (SYNTHROID) 50 mcg Oral Tablet Take 1 Tablet (50 mcg total) by mouth    montelukast (SINGULAIR) 10 mg Oral Tablet Take 1 Tablet (10 mg total) by mouth Once a day    omega-3 fatty acid (LOVAZA) 1 gram Oral Capsule TAKE 2 CAPSULES EVERY DAY BY MOUTH .    pantoprazole (PROTONIX) 40 mg Oral Tablet, Delayed Release (E.C.) Take 1 Tablet (40 mg total) by mouth    pregabalin (LYRICA) 150 mg Oral Capsule TAKE 1 CAPSULE BY MOUTH EVERY MORNING, THEN 1 CAPSULE AT NOON, THEN 1 CAPSULE BEFORE BEDTIME    rOPINIRole (REQUIP) 1 mg Oral Tablet Take 1 Tablet (1 mg total) by mouth    topiramate (TOPAMAX) 25 mg Oral Capsule, Sprinkle     warfarin (COUMADIN) 5 mg Oral Tablet Take 1 Tablet (5 mg total) by mouth Once a day     ALLERGIES:  Allergies   Allergen Reactions    Oxycodone Nausea/ Vomiting     Azithromycin Diarrhea     PAST SURGICAL HISTORY:  Past Surgical History:   Procedure Laterality Date    CESAREAN SECTION      HX RADICAL MASTECTOMY Right      SOCIAL HISTORY:  Social History     Socioeconomic History    Marital status: Married     Spouse name: Not on file    Number of children: Not on file    Years of education: Not on file    Highest education level: Not on file   Occupational History    Not on file   Tobacco Use    Smoking status: Never    Smokeless tobacco: Never   Vaping Use    Vaping Use: Never used   Substance and Sexual Activity    Alcohol use: Never    Drug use: Yes     Types: Marijuana     Comment: Medical marijuana card    Sexual activity: Not on file   Other Topics Concern    Not on file   Social History Narrative    Not on file     Social Determinants of Health     Financial Resource Strain: Not on file   Transportation Needs: Not on file   Social Connections: Not on file   Intimate Partner Violence: Not on file   Housing Stability: Not on file     FAMILY HISTORY:  Family Medical History:       Problem Relation (Age of Onset)    Asthma Mother    Congestive Heart Failure Mother    Diabetes Paternal Aunt    Hypertension (High Blood Pressure) Mother, Brother    Parkinsons Disease Brother          PHYSICAL EXAMINATION:  Vitals: Most Recent Vitals    LMP  (LMP Unknown)      Appearance: Normal appearance.    HENT:      Head: Normocephalic and atraumatic.      Nose: Nose normal.      Mouth/Throat:      Mouth: Mucous membranes are moist.   Cardiovascular:      Rate and Rhythm: Normal rate and regular rhythm.      Pulses: Normal pulses.      Heart sounds: Normal heart sounds.   Pulmonary:      Effort: Pulmonary  effort is normal.      Breath sounds: Normal breath sounds.   Abdominal:      General: Abdomen is flat. Bowel sounds are normal.      Palpations: Abdomen is soft.   Breasts:  S/p rt mastectomy  Left breast; no masses, discharge or adenopathy  Skin:     General: Skin is warm and dry.    Neurological:      General: No focal deficit present.      Mental Status: She is alert and oriented to person, place, and time. Mental status is at baseline.   Psychiatric:         Mood and Affect: Mood normal.         Behavior: Behavior normal.         Thought Content: Thought content normal.         Judgment: Judgment normal.      LABORATORY:  No results found for this or any previous visit (from the past 72 hour(s)).    IMAGING:  N.A.    ASSESSMENT:  1. 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    03/27/05 R breast biopsy: IDC + DCIS.    She appears to remain in complete remission by H&P, labs, and imaging.  Patient has no signs or symptoms of recurrence. Continue surveillance. Continue Anastrozole.    2. FVL mutation with h/o DVT on indefinitie Warfarin.    Details unknown and no records for confirmation. Will continue warfarin for reported hypercoagulable state.    PLAN  1. Continue with warfarin.  2.   RTC 3 months.     Percell Miller, MD

## 2022-05-23 ENCOUNTER — Encounter (INDEPENDENT_AMBULATORY_CARE_PROVIDER_SITE_OTHER): Payer: Self-pay | Admitting: MEDICAL ONCOLOGY

## 2022-06-01 ENCOUNTER — Other Ambulatory Visit: Payer: Self-pay

## 2022-06-01 ENCOUNTER — Emergency Department
Admission: EM | Admit: 2022-06-01 | Discharge: 2022-06-29 | Disposition: E | Payer: Medicare Other | Attending: Emergency Medicine | Admitting: Emergency Medicine

## 2022-06-01 DIAGNOSIS — Z86711 Personal history of pulmonary embolism: Secondary | ICD-10-CM | POA: Insufficient documentation

## 2022-06-01 DIAGNOSIS — E119 Type 2 diabetes mellitus without complications: Secondary | ICD-10-CM | POA: Insufficient documentation

## 2022-06-01 DIAGNOSIS — I469 Cardiac arrest, cause unspecified: Secondary | ICD-10-CM | POA: Insufficient documentation

## 2022-06-01 DIAGNOSIS — I10 Essential (primary) hypertension: Secondary | ICD-10-CM | POA: Insufficient documentation

## 2022-06-01 DIAGNOSIS — R092 Respiratory arrest: Secondary | ICD-10-CM

## 2022-06-01 DIAGNOSIS — Z7901 Long term (current) use of anticoagulants: Secondary | ICD-10-CM | POA: Insufficient documentation

## 2022-06-01 MED ORDER — SODIUM BICARBONATE 8.4 % (1 MEQ/ML) INTRAVENOUS SYRINGE
INJECTION | Freq: Once | INTRAVENOUS | Status: AC | PRN
Start: 2022-06-01 — End: 2022-06-01
  Administered 2022-06-01: 50 meq via INTRAVENOUS

## 2022-06-01 MED ORDER — EPINEPHRINE 0.1 MG/ML INJECTION SYRINGE
INJECTION | Freq: Once | INTRAMUSCULAR | Status: AC | PRN
Start: 2022-06-01 — End: 2022-06-01
  Administered 2022-06-01 (×3): 1 mg via INTRAVENOUS

## 2022-06-29 NOTE — Code Documentation (Signed)
Throughout the code ACLS protocols were followed, with pulse checks, continued evaluation, and modifications as necessary.

## 2022-06-29 NOTE — ED Nurses Note (Signed)
Medical examiner to be in at [place] back with Pt.

## 2022-06-29 NOTE — ED Triage Notes (Signed)
From [place]  found unresponsive approx 30 min  CPR in progress intubated and IO in lt tibial

## 2022-06-29 NOTE — ED Nurses Note (Signed)
Kelly Harrison and [facility] in [place] notified of pt's death, given appropriate information. Pt sent to morgue by security.

## 2022-06-29 NOTE — Code Documentation (Signed)
Events leading to the code:EMS gave 3 epi via IO prior to arrival to the ER

## 2022-06-29 NOTE — ED Provider Notes (Signed)
[facility], [facility] Emergency Department  ED Primary Provider Note  History of Present Illness   Chief Complaint   Patient presents with    Cardiac Arrest     Kelly Harrison is a 61 y.o. female who had concerns including Cardiac Arrest.  Arrival: The patient arrived by Ambulance complaining of being found at [facility] home asystolic with respiratory arrest.  Staff claims they saw her earlier in the day and she was fine.  They called EMS at 445.  EMS then arrived and found no pulses or respirations.  Patient was asystolic.  They gave 3 rounds of epinephrine after intubating the person.  They stated that she never changed rhythms from asystolic.  When patient arrived in the ED patient had no breath sounds and no pulse.  We put her on a monitor and patient was asystole.  Patient then received 3 more rounds of epinephrine 1 amp each.  She also received 1 amp of bicarb.  Patient never changed out of asystole.  Patient was then pronounced at 1702.      HPI  Review of Systems   Review of Systems   Constitutional:  Positive for activity change and appetite change. Negative for chills and fever.   HENT:  Negative for ear pain and sore throat.    Eyes:  Negative for pain and visual disturbance.   Respiratory:  Positive for apnea. Negative for cough and shortness of breath.    Cardiovascular:  Negative for chest pain and palpitations.   Gastrointestinal:  Negative for abdominal pain and vomiting.   Genitourinary:  Negative for dysuria and hematuria.   Musculoskeletal:  Negative for arthralgias and back pain.   Skin:  Negative for color change and rash.   Neurological:  Negative for seizures and syncope.   All other systems reviewed and are negative.     Historical Data   History Reviewed This Encounter:     Physical Exam   ED Triage Vitals   BP    Pulse    Resp    Temp    SpO2    Weight    Height      Physical Exam  Vitals and nursing note reviewed.   Constitutional:       General:  She is not in acute distress.     Appearance: She is well-developed. She is obese.      Comments: Patient was unresponsive on arrival to the ED.   HENT:      Head: Normocephalic and atraumatic.      Right Ear: External ear normal.      Left Ear: External ear normal.      Nose: Nose normal.      Comments: Patient was with central cyanosis.     Mouth/Throat:      Mouth: Mucous membranes are dry.   Eyes:      Conjunctiva/sclera: Conjunctivae normal.   Cardiovascular:      Heart sounds: No murmur heard.     Comments: No pulses.  Patient had no heart sounds and was asystole on the monitor  Pulmonary:      Effort: No respiratory distress.      Comments: Patient had no spontaneous breath sounds.  Patient was respiratory arrest  Abdominal:      Palpations: Abdomen is soft.      Tenderness: There is no abdominal tenderness.   Musculoskeletal:         General: Swelling present.  Cervical back: Neck supple.      Comments: 3+ pitting edema bilateral lower legs   Skin:     General: Skin is warm and dry.      Capillary Refill: Capillary refill takes less than 2 seconds.      Coloration: Skin is pale.      Comments: Patient was pale, cyanotic, and present lividity in the arms and torso   Neurological:      Comments: Unresponsive with no pulses or respirations   Psychiatric:      Comments: Unresponsive       Patient Data   Labs Ordered/Reviewed - No data to display  No orders to display     Medical Decision Making        Medical Decision Making  Patient is 61 year old white female who was found by nursing staff at [facility] home with asystole respiratory arrest.  Unknown down time although staff states it was quarter to 5 when they found her without any vitals.  They stated they checked her throughout the day and she was normal.  EMS was then called and patient got intubated on the scene.  She then had an IO placed in left lower leg.  Patient was given epinephrine x3 in route.  Patient on arrival was asystolic and  respiratory arrest.  Patient was then given 3 epinephrine amps as well as 1 amp of bicarb without any change in her rhythm from asystole.  Patient was pronounced dead at 5:02 p.m.Marland Kitchen  Patient has a long history of hypertension diabetes.  Patient also had a PE in the past and was presently on Eliquis.             Medications Administered in the ED   EPINEPHrine (ADRENALIN) 0.1 mg/mL injection (1 mg Intravenous Given Jun 29, 2022 1702)     Clinical Impression   Respiratory arrest (CMS HCC) (Primary)   Asystole (CMS HCC)   Cardiac arrest (CMS HCC)       Disposition: Expired               Clinical Impression   Respiratory arrest (CMS HCC) (Primary)   Asystole (CMS HCC)   Cardiac arrest (CMS [facility])       Current Discharge Medication List

## 2022-06-29 NOTE — ED Nurses Note (Signed)
Daughter states, Pt to go to [facility]. [place] .

## 2022-06-29 NOTE — ED Nurses Note (Signed)
Medical examiner notified. Will call back.  Leave all lines in place until informed

## 2022-06-29 NOTE — ED Nurses Note (Signed)
Dr. Scarlett Presto and Charge nurse notifying family in quiet room.

## 2022-06-29 NOTE — ED Nurses Note (Signed)
CPR in progress. Intubated bagging with 100% 02 . IO to left lower leg. See CODE Blue charting.

## 2022-06-29 NOTE — ED Nurses Note (Signed)
06/02/2022 0830 --- Body released by security to [facility]

## 2022-06-29 NOTE — Code Documentation (Signed)
Events leading to the code:  was at [facility] nsg home  EMS called at 1615 arrived at 1622 intubated with size 7.5 at 22cm lip  IO put in Lt tibial    EMS stated that nursing staff saw pt at approximately 1530 and pt was ok then found pt at 1615 unresponsive  family was called by nursing home staff

## 2022-06-29 NOTE — Code Documentation (Signed)
Family updated as to patient's status.

## 2022-06-29 DEATH — deceased
